# Patient Record
Sex: Female | Born: 1975 | Race: Asian | Hispanic: No | Marital: Married | Smoking: Never smoker
Health system: Southern US, Community
[De-identification: ages and names within clinical notes are randomized; demographics above are authoritative.]

## PROBLEM LIST (undated history)

## (undated) ENCOUNTER — Inpatient Hospital Stay (HOSPITAL_COMMUNITY): Payer: Self-pay

## (undated) DIAGNOSIS — E559 Vitamin D deficiency, unspecified: Secondary | ICD-10-CM

## (undated) DIAGNOSIS — N926 Irregular menstruation, unspecified: Secondary | ICD-10-CM

## (undated) DIAGNOSIS — L659 Nonscarring hair loss, unspecified: Secondary | ICD-10-CM

## (undated) DIAGNOSIS — Z8619 Personal history of other infectious and parasitic diseases: Secondary | ICD-10-CM

## (undated) DIAGNOSIS — N83209 Unspecified ovarian cyst, unspecified side: Secondary | ICD-10-CM

## (undated) DIAGNOSIS — D649 Anemia, unspecified: Secondary | ICD-10-CM

## (undated) DIAGNOSIS — K219 Gastro-esophageal reflux disease without esophagitis: Secondary | ICD-10-CM

## (undated) DIAGNOSIS — N970 Female infertility associated with anovulation: Secondary | ICD-10-CM

## (undated) DIAGNOSIS — E282 Polycystic ovarian syndrome: Secondary | ICD-10-CM

## (undated) HISTORY — PX: SKIN CANCER EXCISION: SHX779

## (undated) HISTORY — DX: Female infertility associated with anovulation: N97.0

## (undated) HISTORY — DX: Vitamin D deficiency, unspecified: E55.9

## (undated) HISTORY — DX: Nonscarring hair loss, unspecified: L65.9

## (undated) HISTORY — DX: Gastro-esophageal reflux disease without esophagitis: K21.9

## (undated) HISTORY — DX: Unspecified ovarian cyst, unspecified side: N83.209

## (undated) HISTORY — DX: Personal history of other infectious and parasitic diseases: Z86.19

## (undated) HISTORY — DX: Polycystic ovarian syndrome: E28.2

## (undated) HISTORY — PX: NO PAST SURGERIES: SHX2092

## (undated) HISTORY — DX: Irregular menstruation, unspecified: N92.6

---

## 2012-09-05 ENCOUNTER — Ambulatory Visit: Payer: Self-pay | Admitting: Obstetrics and Gynecology

## 2012-09-08 ENCOUNTER — Telehealth: Payer: Self-pay | Admitting: Obstetrics and Gynecology

## 2012-09-11 ENCOUNTER — Encounter: Payer: Self-pay | Admitting: Obstetrics and Gynecology

## 2012-09-11 ENCOUNTER — Ambulatory Visit (INDEPENDENT_AMBULATORY_CARE_PROVIDER_SITE_OTHER): Payer: BC Managed Care – PPO | Admitting: Obstetrics and Gynecology

## 2012-09-11 VITALS — BP 110/64 | Ht 64.5 in | Wt 105.0 lb

## 2012-09-11 DIAGNOSIS — E559 Vitamin D deficiency, unspecified: Secondary | ICD-10-CM | POA: Insufficient documentation

## 2012-09-11 DIAGNOSIS — N926 Irregular menstruation, unspecified: Secondary | ICD-10-CM | POA: Insufficient documentation

## 2012-09-11 DIAGNOSIS — N83209 Unspecified ovarian cyst, unspecified side: Secondary | ICD-10-CM | POA: Insufficient documentation

## 2012-09-11 DIAGNOSIS — E282 Polycystic ovarian syndrome: Secondary | ICD-10-CM

## 2012-09-11 DIAGNOSIS — L659 Nonscarring hair loss, unspecified: Secondary | ICD-10-CM | POA: Insufficient documentation

## 2012-09-11 DIAGNOSIS — N970 Female infertility associated with anovulation: Secondary | ICD-10-CM

## 2012-09-11 MED ORDER — INOSITOL-FOLIC ACID 2000-200 MG-MCG PO PACK: 1.0000 | PACK | Freq: Every day | ORAL | Status: DC

## 2012-09-11 NOTE — Patient Instructions (Signed)
Polycystic Ovarian Syndrome Polycystic ovarian syndrome is a condition with a number of problems. One problem is with the ovaries. The ovaries are organs located in the female pelvis, on each side of the uterus. Usually, during the menstrual cycle, an egg is released from 1 ovary every month. This is called ovulation. When the egg is fertilized, it goes into the womb (uterus), which allows for the growth of a baby. The egg travels from the ovary through the fallopian tube to the uterus. The ovaries also make the hormones estrogen and progesterone. These hormones help the development of a woman's breasts, body shape, and body hair. They also regulate the menstrual cycle and pregnancy. Sometimes, cysts form in the ovaries. A cyst is a fluid-filled sac. On the ovary, different types of cysts can form. The most common type of ovarian cyst is called a functional or ovulation cyst. It is normal, and often forms during the normal menstrual cycle. Each month, a woman's ovaries grow tiny cysts that hold the eggs. When an egg is fully grown, the sac breaks open. This releases the egg. Then, the sac which released the egg from the ovary dissolves. In one type of functional cyst, called a follicle cyst, the sac does not break open to release the egg. It may actually continue to grow. This type of cyst usually disappears within 1 to 3 months.  One type of cyst problem with the ovaries is called Polycystic Ovarian Syndrome (PCOS). In this condition, many follicle cysts form, but do not rupture and produce an egg. This health problem can affect the following:  Menstrual cycle.  Heart.  Obesity.  Cancer of the uterus.  Fertility.  Blood vessels.  Hair growth (face and body) or baldness.  Hormones.  Appearance.  High blood pressure.  Stroke.  Insulin production.  Inflammation of the liver.  Elevated blood cholesterol and triglycerides. CAUSES   No one knows the exact cause of PCOS.  Women with  PCOS often have a mother or sister with PCOS. There is not yet enough proof to say this is inherited.  Many women with PCOS have a weight problem.  Researchers are looking at the relationship between PCOS and the body's ability to make insulin. Insulin is a hormone that regulates the change of sugar, starches, and other food into energy for the body's use, or for storage. Some women with PCOS make too much insulin. It is possible that the ovaries react by making too many female hormones, called androgens. This can lead to acne, excessive hair growth, weight gain, and ovulation problems.  Too much production of luteinizing hormone (LH) from the pituitary gland in the brain stimulates the ovary to produce too much female hormone (androgen). SYMPTOMS   Infrequent or no menstrual periods, and/or irregular bleeding.  Inability to get pregnant (infertility), because of not ovulating.  Increased growth of hair on the face, chest, stomach, back, thumbs, thighs, or toes.  Acne, oily skin, or dandruff.  Pelvic pain.  Weight gain or obesity, usually carrying extra weight around the waist.  Type 2 diabetes (this is the diabetes that usually does not need insulin).  High cholesterol.  High blood pressure.  Female-pattern baldness or thinning hair.  Patches of thickened and dark brown or black skin on the neck, arms, breasts, or thighs.  Skin tags, or tiny excess flaps of skin, in the armpits or neck area.  Sleep apnea (excessive snoring and breathing stops at times while asleep).  Deepening of the voice.  Gestational diabetes when pregnant.  Increased risk of miscarriage with pregnancy. DIAGNOSIS  There is no single test to diagnose PCOS.   Your caregiver will:  Take a medical history.  Perform a pelvic exam.  Perform an ultrasound.  Check your female and female hormone levels.  Measure glucose or sugar levels in the blood.  Do other blood tests.  If you are producing too many  female hormones, your caregiver will make sure it is from PCOS. At the physical exam, your caregiver will want to evaluate the areas of increased hair growth. Try to allow natural hair growth for a few days before the visit.  During a pelvic exam, the ovaries may be enlarged or swollen by the increased number of small cysts. This can be seen more easily by vaginal ultrasound or screening, to examine the ovaries and lining of the uterus (endometrium) for cysts. The uterine lining may become thicker, if there has not been a regular period. TREATMENT  Because there is no cure for PCOS, it needs to be managed to prevent problems. Treatments are based on your symptoms. Treatment is also based on whether you want to have a baby or whether you need contraception.  Treatment may include:  Progesterone hormone, to start a menstrual period.  Birth control pills, to make you have regular menstrual periods.  Medicines to make you ovulate, if you want to get pregnant.  Medicines to control your insulin.  Medicine to control your blood pressure.  Medicine and diet, to control your high cholesterol and triglycerides in your blood.  Surgery, making small holes in the ovary, to decrease the amount of female hormone production. This is done through a long, lighted tube (laparoscope), placed into the pelvis through a tiny incision in the lower abdomen. Your caregiver will go over some of the choices with you. WOMEN WITH PCOS HAVE THESE CHARACTERISTICS:  High levels of female hormones called androgens.  An irregular or no menstrual cycle.  May have many small cysts in their ovaries. PCOS is the most common hormonal reproductive problem in women of childbearing age. WHY DO WOMEN WITH PCOS HAVE TROUBLE WITH THEIR MENSTRUAL CYCLE? Each month, about 20 eggs start to mature in the ovaries. As one egg grows and matures, the follicle breaks open to release the egg, so it can travel through the fallopian tube for  fertilization. When the single egg leaves the follicle, ovulation takes place. In women with PCOS, the ovary does not make all of the hormones it needs for any of the eggs to fully mature. They may start to grow and accumulate fluid, but no one egg becomes large enough. Instead, some may remain as cysts. Since no egg matures or is released, ovulation does not occur and the hormone progesterone is not made. Without progesterone, a woman's menstrual cycle is irregular or absent. Also, the cysts produce female hormones, which continue to prevent ovulation.  Document Released: 02/25/2005 Document Revised: 01/24/2012 Document Reviewed: 09/19/2009 [facility] Patient Information 2013 [place], [place].  Home Tests Luteinizing Hormone  Condition: Ovulation Purpose: Predicting time when woman is most likely to be fertile Format: SA Availability: OTC Comments: Certain monitoring devices can store information and accessed by the couple and their health provider for retrospective review.

## 2012-09-11 NOTE — Progress Notes (Signed)
ANNUAL:  Last Pap: 09/01/2011 HPV not detected WNL: Yes Regular Periods:no Contraception: None  Monthly Breast exam:no Tetanus<29yrs:no Nl.Bladder Function:yes Daily BMs:yes Healthy Diet:yes Calcium:yes Mammogram:no Date of Mammogram: n/a Exercise:yes Have often Exercise: occasionaly Seatbelt: yes Abuse at home: no Stressful work:no Sigmoid-colonoscopy: n/a Bone Density: No PCP: Gretta Obuck Change in PMH: None Change in [facility]: None  Subjective:    Wanda Reilly is a 36 y.o. female, G0P0000, who presents for an annual exam.     History   Social History  . Marital Status: Single    Spouse Name: N/A    Number of Children: N/A  . Years of Education: N/A   Social History Main Topics  . Smoking status: Never Smoker   . Smokeless tobacco: Never Used  . Alcohol Use: No  . Drug Use: No  . Sexually Active: Yes    Birth Control/ Protection: None   Other Topics Concern  . None   Social History Narrative  . None    Menstrual cycle:   LMP: Patient's last menstrual period was 09/01/2012.           Cycle: Now q26-27 days on pregnitude.  occ premenstrual breast tenderness and cramps first day  The following portions of the patient's history were reviewed and updated as appropriate: allergies, current medications, past family history, past medical history, past social history, past surgical history and problem list.  Review of Systems Pertinent items are noted in HPI. Breast:Negative for breast lump,nipple discharge or nipple retraction Gastrointestinal: Negative for abdominal pain, change in bowel habits or rectal bleeding Urinary:negative   Objective:    BP 110/64  Ht 5' 4.5" (1.638 m)  Wt 105 lb (47.628 kg)  BMI 17.74 kg/m2  LMP 09/01/2012    Weight:  Wt Readings from Last 1 Encounters:  09/11/12 105 lb (47.628 kg)          BMI: Body mass index is 17.74 kg/(m^2).  General Appearance: Alert, appropriate appearance for age. No acute distress HEENT: Grossly  normal Neck / Thyroid: Supple, no masses, nodes or enlargement Lungs: clear to auscultation bilaterally Back: No CVA tenderness Breast Exam: No masses or nodes.No dimpling, nipple retraction or discharge. Cardiovascular: Regular rate and rhythm. S1, S2, no murmur Gastrointestinal: Soft, non-tender, no masses or organomegaly Pelvic Exam: Vulva and vagina appear normal. Bimanual exam reveals normal uterus and adnexa. Rectovaginal: normal rectal, no masses Lymphatic Exam: Non-palpable nodes in neck, clavicular, axillary, or inguinal regions Skin: no rash or abnormalities Neurologic: Normal gait and speech, no tremor  Psychiatric: Alert and oriented, appropriate affect.   Wet Prep:not applicable Urinalysis:not applicable UPT: Not done   Assessment:    PCOS  Primary infertility   Plan:    Discussed ovulation after 3 more mos of trying to conceive Continue Pregnitude CSA for husband STD screening: declined Contraception:no method F/u 3mos   Dierdre Forth MD

## 2012-09-22 ENCOUNTER — Telehealth: Payer: Self-pay | Admitting: Obstetrics and Gynecology

## 2012-09-22 NOTE — Telephone Encounter (Signed)
Tc from pt. Informed pt of [facility] recs. Pt opts to have SA repeated x 6wks. Order for CSA faxed to [facility] and mailed to pt's home. Questions answered rgdg infertility. Pt may decide to proceed with infertility workup sooner than 3 months of trying to conceive naturally. Informed pt to call office 1st day of cycle to consult with provider rgdg labs needed for infertility workup. Pt voices understanding.

## 2012-09-22 NOTE — Telephone Encounter (Signed)
Tc to pt per telephone call. Informed pt VPH to review SA results and will cb with recs. Pt voices understanding.

## 2012-09-22 NOTE — Telephone Encounter (Signed)
Lm on vm to cb per VPH recs. 

## 2012-09-22 NOTE — Telephone Encounter (Signed)
Semen analysis is abnormal Options: Repeat in 6 weeks or refer to urologist at this time.  If patient wants referral to her urologist, refer to Dr. Brunilda Payor.  If she wants to repeat the semen analysis give her an additional order

## 2013-01-08 ENCOUNTER — Telehealth: Payer: Self-pay | Admitting: Obstetrics and Gynecology

## 2013-01-09 NOTE — Telephone Encounter (Signed)
Returned pt's call. States there is a possibility she is pregnant and is questioning if OK to continue Pregnitude. Menses was due 01/06/13 but had only brown spotting.  UPT nef 01/08/13. Also states was unsure when she ovulated past month. To wait a few days to retest.  Call wit positive result. Will consult with DR VPH concerning continuing supplement.

## 2013-01-10 ENCOUNTER — Telehealth: Payer: Self-pay | Admitting: Obstetrics and Gynecology

## 2013-01-10 NOTE — Telephone Encounter (Signed)
Message copied by Mason Jim on Wed Jan 10, 2013 10:05 AM ------      Message from: Dierdre Forth P      Created: Wed Jan 10, 2013  9:35 AM      Regarding: RE: Continue supplement?       yes      ----- Message -----         From: Constance Haw, RN         Sent: 01/09/2013   9:36 AM           To: Hal Morales, MD      Subject: Continue supplement?                                     Pt states may be pregnant and questioning if OK to continue Pregnitude. Thanks, Harriett Sine       ------

## 2013-01-10 NOTE — Telephone Encounter (Signed)
TC to pt. Per DR [facility]PH advised may continue Pregnitude.

## 2013-04-17 LAB — OB RESULTS CONSOLE ABO/RH: RH TYPE: NEGATIVE

## 2013-04-17 LAB — OB RESULTS CONSOLE RUBELLA ANTIBODY, IGM: Rubella: IMMUNE

## 2013-04-17 LAB — OB RESULTS CONSOLE GC/CHLAMYDIA
Chlamydia: NEGATIVE
GC PROBE AMP, GENITAL: NEGATIVE

## 2013-04-17 LAB — OB RESULTS CONSOLE ANTIBODY SCREEN: Antibody Screen: NEGATIVE

## 2013-04-17 LAB — OB RESULTS CONSOLE RPR: RPR: NONREACTIVE

## 2013-04-17 LAB — OB RESULTS CONSOLE HIV ANTIBODY (ROUTINE TESTING): HIV: NONREACTIVE

## 2013-04-17 LAB — OB RESULTS CONSOLE HEPATITIS B SURFACE ANTIGEN: Hepatitis B Surface Ag: NEGATIVE

## 2013-11-15 NOTE — L&D Delivery Note (Addendum)
Delivery Note At 1:45 PM a viable female was delivered via Vaginal, Vacuum (Extractor) (Presentation: ; Occiput Anterior).  APGAR: 9, 9; weight 6 lb 15.5 oz (3161 g).  VAVD performed secondary to maternal exhaustion.  Baby was delivered after 2 sets of pulls with 2 ctxs at 40-6450mmhg of pressure.   Placenta status: Intact, Spontaneous.  Cord: 3 vessels with the following complications: None.  Cord pH: n/a  Anesthesia: Epidural  Episiotomy: not done Lacerations: 3rd degree;Perineal Suture Repair: 2.0 vicryl Est. Blood Loss (mL): 400 cc  Mom to postpartum.  Baby to Nursery.  [name],Wanda Spivey Y 11/29/2013, 3:55 PM

## 2013-11-17 ENCOUNTER — Inpatient Hospital Stay (HOSPITAL_COMMUNITY)
Admission: AD | Admit: 2013-11-17 | Payer: BC Managed Care – PPO | Source: Ambulatory Visit | Admitting: Obstetrics and Gynecology

## 2013-11-27 ENCOUNTER — Encounter (HOSPITAL_COMMUNITY): Payer: Self-pay | Admitting: *Deleted

## 2013-11-27 ENCOUNTER — Telehealth (HOSPITAL_COMMUNITY): Payer: Self-pay | Admitting: *Deleted

## 2013-11-27 NOTE — Telephone Encounter (Signed)
Preadmission screen  

## 2013-11-28 ENCOUNTER — Inpatient Hospital Stay (HOSPITAL_COMMUNITY)
Admission: RE | Admit: 2013-11-28 | Discharge: 2013-12-01 | DRG: 775 | Disposition: A | Discharge status: Home / Self Care | Payer: BC Managed Care – PPO | Source: Ambulatory Visit | Attending: Obstetrics and Gynecology | Admitting: Obstetrics and Gynecology

## 2013-11-28 ENCOUNTER — Encounter (HOSPITAL_COMMUNITY): Payer: Self-pay

## 2013-11-28 ENCOUNTER — Encounter (HOSPITAL_COMMUNITY): Payer: BC Managed Care – PPO | Admitting: Anesthesiology

## 2013-11-28 ENCOUNTER — Inpatient Hospital Stay (HOSPITAL_COMMUNITY): Payer: BC Managed Care – PPO | Admitting: Anesthesiology

## 2013-11-28 DIAGNOSIS — O99892 Other specified diseases and conditions complicating childbirth: Secondary | ICD-10-CM | POA: Diagnosis present

## 2013-11-28 DIAGNOSIS — O09519 Supervision of elderly primigravida, unspecified trimester: Secondary | ICD-10-CM | POA: Diagnosis present

## 2013-11-28 DIAGNOSIS — Z22330 Carrier of Group B streptococcus: Secondary | ICD-10-CM

## 2013-11-28 DIAGNOSIS — O9989 Other specified diseases and conditions complicating pregnancy, childbirth and the puerperium: Secondary | ICD-10-CM

## 2013-11-28 DIAGNOSIS — O480 Post-term pregnancy: Secondary | ICD-10-CM | POA: Diagnosis present

## 2013-11-28 DIAGNOSIS — D649 Anemia, unspecified: Secondary | ICD-10-CM | POA: Diagnosis not present

## 2013-11-28 DIAGNOSIS — O99820 Streptococcus B carrier state complicating pregnancy: Secondary | ICD-10-CM

## 2013-11-28 DIAGNOSIS — D62 Acute posthemorrhagic anemia: Secondary | ICD-10-CM | POA: Diagnosis not present

## 2013-11-28 DIAGNOSIS — O9903 Anemia complicating the puerperium: Secondary | ICD-10-CM | POA: Diagnosis not present

## 2013-11-28 LAB — CBC
HCT: 41.2 % (ref 36.0–46.0)
Hemoglobin: 14.5 g/dL (ref 12.0–15.0)
MCH: 30.7 pg (ref 26.0–34.0)
MCHC: 35.2 g/dL (ref 30.0–36.0)
MCV: 87.1 fL (ref 78.0–100.0)
PLATELETS: 181 10*3/uL (ref 150–400)
RBC: 4.73 MIL/uL (ref 3.87–5.11)
RDW: 14.9 % (ref 11.5–15.5)
WBC: 13.7 10*3/uL — AB (ref 4.0–10.5)

## 2013-11-28 LAB — OB RESULTS CONSOLE GBS: GBS: POSITIVE

## 2013-11-28 LAB — TYPE AND SCREEN
ABO/RH(D): O NEG
ANTIBODY SCREEN: NEGATIVE

## 2013-11-28 MED ORDER — LACTATED RINGERS IV SOLN: 500.0000 mL | INTRAVENOUS | Status: DC | PRN

## 2013-11-28 MED ORDER — PHENYLEPHRINE 40 MCG/ML (10ML) SYRINGE FOR IV PUSH (FOR BLOOD PRESSURE SUPPORT)
80.0000 ug | PREFILLED_SYRINGE | INTRAVENOUS | Status: DC | PRN
  Filled 2013-11-28: qty 2

## 2013-11-28 MED ORDER — PENICILLIN G POTASSIUM 5000000 UNITS IJ SOLR
5.0000 10*6.[IU] | Freq: Once | INTRAVENOUS | Status: AC
  Administered 2013-11-28: 5 10*6.[IU] via INTRAVENOUS
  Filled 2013-11-28: qty 5

## 2013-11-28 MED ORDER — OXYTOCIN 40 UNITS IN LACTATED RINGERS INFUSION - SIMPLE MED: 62.5000 mL/h | INTRAVENOUS | Status: DC

## 2013-11-28 MED ORDER — LACTATED RINGERS IV SOLN
INTRAVENOUS | Status: DC
  Administered 2013-11-28 (×2): via INTRAVENOUS

## 2013-11-28 MED ORDER — CITRIC ACID-SODIUM CITRATE 334-500 MG/5ML PO SOLN: 30.0000 mL | ORAL | Status: DC | PRN

## 2013-11-28 MED ORDER — BUTORPHANOL TARTRATE 1 MG/ML IJ SOLN: 1.0000 mg | INTRAMUSCULAR | Status: DC | PRN

## 2013-11-28 MED ORDER — DIPHENHYDRAMINE HCL 50 MG/ML IJ SOLN: 12.5000 mg | INTRAMUSCULAR | Status: DC | PRN

## 2013-11-28 MED ORDER — PHENYLEPHRINE 40 MCG/ML (10ML) SYRINGE FOR IV PUSH (FOR BLOOD PRESSURE SUPPORT)
80.0000 ug | PREFILLED_SYRINGE | INTRAVENOUS | Status: DC | PRN
  Filled 2013-11-28: qty 10
  Filled 2013-11-28: qty 2

## 2013-11-28 MED ORDER — LIDOCAINE HCL (PF) 1 % IJ SOLN
30.0000 mL | INTRAMUSCULAR | Status: DC | PRN
  Filled 2013-11-28 (×2): qty 30

## 2013-11-28 MED ORDER — EPHEDRINE 5 MG/ML INJ
10.0000 mg | INTRAVENOUS | Status: DC | PRN
  Filled 2013-11-28: qty 2
  Filled 2013-11-28: qty 4

## 2013-11-28 MED ORDER — FENTANYL 2.5 MCG/ML BUPIVACAINE 1/10 % EPIDURAL INFUSION (WH - ANES)
14.0000 mL/h | INTRAMUSCULAR | Status: DC | PRN
  Administered 2013-11-28 – 2013-11-29 (×2): 14 mL/h via EPIDURAL
  Filled 2013-11-28 (×2): qty 125

## 2013-11-28 MED ORDER — FENTANYL CITRATE 0.05 MG/ML IJ SOLN
100.0000 ug | INTRAMUSCULAR | Status: DC | PRN
  Filled 2013-11-28 (×2): qty 2

## 2013-11-28 MED ORDER — STERILE WATER FOR INJECTION IJ SOLN
10.0000 mL | INTRAMUSCULAR | Status: DC | PRN
  Administered 2013-11-28: 10 mL via INTRAMUSCULAR
  Filled 2013-11-28: qty 10

## 2013-11-28 MED ORDER — ONDANSETRON HCL 4 MG/2ML IJ SOLN: 4.0000 mg | Freq: Four times a day (QID) | INTRAMUSCULAR | Status: DC | PRN

## 2013-11-28 MED ORDER — OXYTOCIN BOLUS FROM INFUSION: 500.0000 mL | INTRAVENOUS | Status: DC

## 2013-11-28 MED ORDER — PENICILLIN G POTASSIUM 5000000 UNITS IJ SOLR
2.5000 10*6.[IU] | INTRAVENOUS | Status: DC
  Administered 2013-11-28 – 2013-11-29 (×4): 2.5 10*6.[IU] via INTRAVENOUS
  Filled 2013-11-28 (×8): qty 2.5

## 2013-11-28 MED ORDER — LACTATED RINGERS IV SOLN
500.0000 mL | Freq: Once | INTRAVENOUS | Status: AC
  Administered 2013-11-28: 500 mL via INTRAVENOUS

## 2013-11-28 MED ORDER — FENTANYL CITRATE 0.05 MG/ML IJ SOLN
100.0000 ug | INTRAMUSCULAR | Status: DC | PRN
  Administered 2013-11-28: 100 ug via INTRAVENOUS

## 2013-11-28 MED ORDER — EPHEDRINE 5 MG/ML INJ
10.0000 mg | INTRAVENOUS | Status: DC | PRN
  Filled 2013-11-28: qty 2

## 2013-11-28 MED ORDER — LIDOCAINE HCL (PF) 1 % IJ SOLN
INTRAMUSCULAR | Status: DC | PRN
  Administered 2013-11-28 (×4): 4 mL

## 2013-11-28 MED ORDER — ACETAMINOPHEN 325 MG PO TABS
650.0000 mg | ORAL_TABLET | ORAL | Status: DC | PRN
  Administered 2013-11-29: 650 mg via ORAL
  Filled 2013-11-28: qty 2

## 2013-11-28 MED ORDER — OXYCODONE-ACETAMINOPHEN 5-325 MG PO TABS: 1.0000 | ORAL_TABLET | ORAL | Status: DC | PRN

## 2013-11-28 NOTE — H&P (Signed)
Wanda Reilly is a 38 y.o. female presenting for induction after waiting for a bed to become available on L&D all day.  Denies LOF, VB and report good FM and ctxs about q 10min.  History OB History   Grav Para Term Preterm Abortions TAB SAB Ect Mult Living   1 0 0 0 0 0 0 0 0 0      Past Medical History  Diagnosis Date  . PCOS (polycystic ovarian syndrome)   . Alopecia   . Vitamin D deficiency   . Anovulation   . Ovarian cyst   . Irregular menses   . GERD (gastroesophageal reflux disease)   . H/O varicella    Past Surgical History  Procedure Laterality Date  . No past surgeries     Family History: family history includes Hypertension in her mother. Social History:  reports that she has never smoked. She has never used smokeless tobacco. She reports that she does not drink alcohol or use illicit drugs.   Prenatal Transfer Tool  Maternal Diabetes: No Genetic Screening: Normal Maternal Ultrasounds/Referrals: Normal Fetal Ultrasounds or other Referrals:  None Maternal Substance Abuse:  No Significant Maternal Medications:  None Significant Maternal Lab Results:  Lab values include: Group B Strep positive Other Comments:  None  ROS Non-contributory   Height 5\' 4"  (1.626 m), weight 63.957 kg (141 lb), last menstrual period 02/10/2013. Exam Physical Exam   Lungs CTA CV RRR ABd gravid, NT Ext no calf tenderness  VE 3-4cm per RN FHT 140s, + accels, no decels, + variability Toco q 5 min  Prenatal labs: ABO, Rh: O/Negative/-- (06/03 0000) Antibody: Negative (06/03 0000) Rubella: Immune (06/03 0000) RPR: Nonreactive (06/03 0000)  HBsAg: Negative (06/03 0000)  HIV: Non-reactive (06/03 0000)  GBS:  positive  Assessment/Plan: P1 at 41 4/7 wks presenting for induction but found to be in early labor.  If not progressing, will augment with pitocin but will observe for now.  Fetal status is reassuring.   [name],Frederick Klinger Y 11/28/2013, 6:37 PM

## 2013-11-28 NOTE — Anesthesia Procedure Notes (Signed)
Epidural Patient location during procedure: OB Start time: 11/28/2013 10:36 PM  Staffing Performed by: anesthesiologist   Preanesthetic Checklist Completed: patient identified, site marked, surgical consent, pre-op evaluation, timeout performed, IV checked, risks and benefits discussed and monitors and equipment checked  Epidural Patient position: sitting Prep: site prepped and draped and DuraPrep Patient monitoring: continuous pulse ox and blood pressure Approach: midline Injection technique: LOR air  Needle:  Needle type: Tuohy  Needle gauge: 17 G Needle length: 9 cm and 9 Needle insertion depth: 4 cm Catheter type: closed end flexible Catheter size: 19 Gauge Catheter at skin depth: 9 cm Test dose: negative  Assessment Events: blood not aspirated, injection not painful, no injection resistance, negative IV test and no paresthesia  Additional Notes Discussed risk of headache, infection, bleeding, nerve injury and failed or incomplete block.  Patient voices understanding and wishes to proceed. Epidural placed easily on first pass.  No paresthesia.  Patient tolerated procedure well with no apparent complications.  Jasmine DecemberA. Jaycelyn Orrison, MDReason for block:procedure for pain

## 2013-11-28 NOTE — Anesthesia Preprocedure Evaluation (Signed)
Anesthesia Evaluation  Patient identified by MRN, date of birth, ID band Patient awake    Reviewed: Allergy & Precautions, H&P , NPO status , Patient's Chart, lab work & pertinent test results, reviewed documented beta blocker date and time   History of Anesthesia Complications Negative for: history of anesthetic complications  Airway Mallampati: II TM Distance: >3 FB Neck ROM: full    Dental  (+) Teeth Intact   Pulmonary neg pulmonary ROS,  breath sounds clear to auscultation        Cardiovascular negative cardio ROS  Rhythm:regular Rate:Normal     Neuro/Psych negative neurological ROS  negative psych ROS   GI/Hepatic Neg liver ROS, GERD-  ,  Endo/Other  PCOS  Renal/GU negative Renal ROS  Female GU complaint     Musculoskeletal   Abdominal   Peds  Hematology negative hematology ROS (+)   Anesthesia Other Findings   Reproductive/Obstetrics (+) Pregnancy                           Anesthesia Physical Anesthesia Plan  ASA: II  Anesthesia Plan: Epidural   Post-op Pain Management:    Induction:   Airway Management Planned:   Additional Equipment:   Intra-op Plan:   Post-operative Plan:   Informed Consent: I have reviewed the patients History and Physical, chart, labs and discussed the procedure including the risks, benefits and alternatives for the proposed anesthesia with the patient or authorized representative who has indicated his/her understanding and acceptance.     Plan Discussed with:   Anesthesia Plan Comments:         Anesthesia Quick Evaluation

## 2013-11-28 NOTE — Progress Notes (Signed)
Patient ID: Wanda Reilly, female   DOB: 19-Sep-1976, 38 y.o.   MRN: [id number] Wanda Reilly is a 38 y.o. G1P0000 at 7480w4d admitted for early labor/IOL   Subjective: rcv'd sterile water injections and had relief for about 30 minutes, breathing w ctx, requesting IV pain meds   Objective: BP 114/74  Pulse 93  Temp(Src) 97.9 F (36.6 C) (Oral)  Resp 18  Ht 5\' 4"  (1.626 m)  Wt 141 lb (63.957 kg)  BMI 24.19 kg/m2  LMP 02/10/2013     FHT:  Cat 1 UC:   toco 3-4   SVE:   Dilation: 5 Effacement (%): 100 Station: -1 Exam by:: s. Pegi Milazzo, cnm    Assessment / Plan:  Labor: Progressing normally Preeclampsia:  no s/s Fetal Wellbeing:  Category I Pain Control:  Fentanyl Anticipated MOD:  NSVD  GBS pos, rcv'd PCN Will AROM 4hrs after rcv'd PCN Fentanyl IVP, now, epidural PRN   Update physician PRN   Graziella Connery M 11/28/2013, 10:10 PM

## 2013-11-28 NOTE — Progress Notes (Signed)
Patient ID: Wanda PoundsKamali Godown, female   DOB: 1976/11/06, 38 y.o.   MRN: [id number] Wanda Reilly is a 38 y.o. G1P0000 at 8944w4d admitted for early labor/IOL   Subjective: Ctx getting stronger, states she is coping with them for now, considering pain meds,   Objective: BP 114/74  Pulse 93  Temp(Src) 97.9 F (36.6 C) (Oral)  Resp 18  Ht 5\' 4"  (1.626 m)  Wt 141 lb (63.957 kg)  BMI 24.19 kg/m2  LMP 02/10/2013     FHT:  Cat 1 UC:   toco 3-4   SVE:  Exam deferred    Assessment / Plan:  Labor: early labor  Preeclampsia:  no s/s Fetal Wellbeing:  Category I Pain Control:  Labor support without medications Anticipated MOD:  NSVD  GBS pos, PCN just started  Will check cervix as labor progresses, consider pitocin augmentation if no further change  Pain meds PRN    Update physician PRN   Khayree Delellis M 11/28/2013, 10:07 PM

## 2013-11-29 DIAGNOSIS — O99820 Streptococcus B carrier state complicating pregnancy: Secondary | ICD-10-CM

## 2013-11-29 LAB — RPR: RPR Ser Ql: NONREACTIVE

## 2013-11-29 LAB — ABO/RH: ABO/RH(D): O NEG

## 2013-11-29 MED ORDER — SIMETHICONE 80 MG PO CHEW
80.0000 mg | CHEWABLE_TABLET | ORAL | Status: DC | PRN
Start: 2013-11-29 — End: 2013-12-01

## 2013-11-29 MED ORDER — FENTANYL CITRATE 0.05 MG/ML IJ SOLN
100.0000 ug | Freq: Once | INTRAMUSCULAR | Status: AC
  Administered 2013-11-29: 100 ug via EPIDURAL

## 2013-11-29 MED ORDER — DIPHENHYDRAMINE HCL 25 MG PO CAPS: 25.0000 mg | ORAL_CAPSULE | Freq: Four times a day (QID) | ORAL | Status: DC | PRN

## 2013-11-29 MED ORDER — ACETAMINOPHEN 325 MG PO TABS
650.0000 mg | ORAL_TABLET | Freq: Four times a day (QID) | ORAL | Status: DC | PRN
  Filled 2013-11-29: qty 2

## 2013-11-29 MED ORDER — PRENATAL MULTIVITAMIN CH
1.0000 | ORAL_TABLET | Freq: Every day | ORAL | Status: DC
  Administered 2013-11-30 – 2013-12-01 (×2): 1 via ORAL
  Filled 2013-11-29 (×2): qty 1

## 2013-11-29 MED ORDER — OXYCODONE-ACETAMINOPHEN 5-325 MG PO TABS
1.0000 | ORAL_TABLET | ORAL | Status: DC | PRN
  Administered 2013-11-29 – 2013-12-01 (×6): 2 via ORAL
  Filled 2013-11-29 (×6): qty 2

## 2013-11-29 MED ORDER — MEASLES, MUMPS & RUBELLA VAC ~~LOC~~ INJ: 0.5000 mL | INJECTION | Freq: Once | SUBCUTANEOUS | Status: DC

## 2013-11-29 MED ORDER — LACTATED RINGERS IV BOLUS (SEPSIS): 500.0000 mL | Freq: Once | INTRAVENOUS | Status: DC

## 2013-11-29 MED ORDER — OXYTOCIN 40 UNITS IN LACTATED RINGERS INFUSION - SIMPLE MED
1.0000 m[IU]/min | INTRAVENOUS | Status: DC
  Administered 2013-11-29: 2 m[IU]/min via INTRAVENOUS
  Administered 2013-11-29: 666 m[IU]/min via INTRAVENOUS
  Filled 2013-11-29: qty 1000

## 2013-11-29 MED ORDER — ZOLPIDEM TARTRATE 5 MG PO TABS: 5.0000 mg | ORAL_TABLET | Freq: Every evening | ORAL | Status: DC | PRN

## 2013-11-29 MED ORDER — BUPIVACAINE HCL (PF) 0.25 % IJ SOLN
INTRAMUSCULAR | Status: DC | PRN
  Administered 2013-11-29: 5 mL via EPIDURAL

## 2013-11-29 MED ORDER — MEDROXYPROGESTERONE ACETATE 150 MG/ML IM SUSP: 150.0000 mg | INTRAMUSCULAR | Status: DC | PRN

## 2013-11-29 MED ORDER — SENNOSIDES-DOCUSATE SODIUM 8.6-50 MG PO TABS
2.0000 | ORAL_TABLET | ORAL | Status: DC
  Administered 2013-11-30 – 2013-12-01 (×2): 2 via ORAL
  Filled 2013-11-29 (×2): qty 2

## 2013-11-29 MED ORDER — ONDANSETRON HCL 4 MG PO TABS: 4.0000 mg | ORAL_TABLET | ORAL | Status: DC | PRN

## 2013-11-29 MED ORDER — FLEET ENEMA 7-19 GM/118ML RE ENEM: 1.0000 | ENEMA | Freq: Every day | RECTAL | Status: DC | PRN

## 2013-11-29 MED ORDER — TETANUS-DIPHTH-ACELL PERTUSSIS 5-2.5-18.5 LF-MCG/0.5 IM SUSP: 0.5000 mL | Freq: Once | INTRAMUSCULAR | Status: DC

## 2013-11-29 MED ORDER — BISACODYL 10 MG RE SUPP: 10.0000 mg | Freq: Every day | RECTAL | Status: DC | PRN

## 2013-11-29 MED ORDER — ONDANSETRON HCL 4 MG/2ML IJ SOLN: 4.0000 mg | INTRAMUSCULAR | Status: DC | PRN

## 2013-11-29 MED ORDER — BENZOCAINE-MENTHOL 20-0.5 % EX AERO
1.0000 "application " | INHALATION_SPRAY | CUTANEOUS | Status: DC | PRN
  Administered 2013-11-29: 1 via TOPICAL
  Filled 2013-11-29: qty 56

## 2013-11-29 MED ORDER — WITCH HAZEL-GLYCERIN EX PADS: 1.0000 "application " | MEDICATED_PAD | CUTANEOUS | Status: DC | PRN

## 2013-11-29 MED ORDER — LANOLIN HYDROUS EX OINT: TOPICAL_OINTMENT | CUTANEOUS | Status: DC | PRN

## 2013-11-29 MED ORDER — DIBUCAINE 1 % RE OINT: 1.0000 "application " | TOPICAL_OINTMENT | RECTAL | Status: DC | PRN

## 2013-11-29 NOTE — Progress Notes (Signed)
No postpartum orders in computer for patient. Called Dr. Estanislado Pandyivard at [phone] who is on call from [phone] and had to leave message. No response by [phone], so called home number. Was instructed to call cell. Will continue to attempt to get postpartum orders placed. Earl Galasborne, Linda HedgesStefanie [place] Green[place]udspeth

## 2013-11-29 NOTE — Progress Notes (Signed)
Wanda Reilly is a 38 y.o. G1P0000 at 5360w5d   Subjective:   Objective: BP 101/53  Pulse 95  Temp(Src) 98.6 F (37 C) (Oral)  Resp 16  Ht 5\' 4"  (1.626 m)  Wt 63.957 kg (141 lb)  BMI 24.19 kg/m2  SpO2 97%  LMP 02/10/2013 I/O last 3 completed shifts: In: -  Out: 1000 [Urine:1000]    FHT:  Cat 2 occas decel UC:   regular, every 2-3 minutes SVE:   Dilation: 9-10  Effacement (%): 100 Station: +1 Exam by:: RN  Labs: Lab Results  Component Value Date   WBC 13.7* 11/28/2013   HGB 14.5 11/28/2013   HCT 41.2 11/28/2013   MCV 87.1 11/28/2013   PLT 181 11/28/2013    Assessment / Plan: Augmentation of labor, progressing well  Labor: Progressing normally Preeclampsia:  no signs or symptoms of toxicity Fetal Wellbeing:  Category II - anticipating NSVD Pain Control:  Epidural I/D:  GBS + on PCN, no s/sxs of toxicity Anticipated MOD:  NSVD  Ojas Coone Y 11/29/2013, 11:29 AM

## 2013-11-29 NOTE — Progress Notes (Signed)
[facility] Lillard, who is now on call, for postpartum orders. Message left. Changing shifts. Will notify next nurse of absent postpartum orders as of now. Earl Galasborne, Linda HedgesStefanie [place]

## 2013-11-29 NOTE — Progress Notes (Signed)
Patient ID: Wanda Reilly, female   DOB: August 09, 1976, 38 y.o.   MRN: [id number] Wanda Reilly is a 38 y.o. G1P0000 at 7529w5d admitted for labor  Subjective: Overall comfortable w epidural, L side more numb, c/o L thigh swelling, now sitting up in bed HF, has been in various positions   Objective: BP 118/69  Pulse 88  Temp(Src) 97.6 F (36.4 C) (Oral)  Resp 18  Ht 5\' 4"  (1.626 m)  Wt 141 lb (63.957 kg)  BMI 24.19 kg/m2  SpO2 99%  LMP 02/10/2013  Total I/O In: -  Out: 1000 [Urine:1000]  FHT:  Cat 1 UC:   toco 1-2, MVU's 130's, but pitocin decreased from 6mu to 2mu for tachysystole   SVE:   Dilation: 8.5 Effacement (%): 90 Station: -1 Exam by:: s. Christyana Corwin, cnm  vtx ?OP asynclitic    Assessment / Plan:  Labor: progressing on pitocin,  Preeclampsia:  no s/s Fetal Wellbeing:  Category I Pain Control:  Epidural Anticipated MOD:  NSVD  GBS pos, rcv'd PCN Continue pitocin titration L thigh w slightly more edema noted, Dr Rodman Pickleassidy notified, and states edema not related to epidural  Pt reassured about numbness Will CTO closely    Update physician PRN   Malissa HippoLILLARD,Michel Eskelson M 11/29/2013, 5:28 AM

## 2013-11-29 NOTE — Progress Notes (Signed)
Patient ID: Wanda Reilly, female   DOB: [birth year], 38 y.o.   MRN: [id number] Wanda Reilly is a 38 y.o. G1P0000 at 777w5d admitted for early labor  Subjective: Comfortable w epidural now, sleepy   Objective: BP 105/75  Pulse 73  Temp(Src) 97.4 F (36.3 C) (Oral)  Resp 18  Ht 5\' 4"  (1.626 m)  Wt 141 lb (63.957 kg)  BMI 24.19 kg/m2  SpO2 99%  LMP 02/10/2013     FHT:  Cat 1 UC:   toco 5-7  SVE:   5/100/-1 AROM, scant clear fluid, bloody show IUPC placed without difficulty    Assessment / Plan:  Labor: early labor Preeclampsia:  no s/s Fetal Wellbeing:  Category I Pain Control:  Epidural Anticipated MOD:  NSVD  GBS pos, rcv'd PCN Will begin pitocin augmentation for adequate ctx    Dr Estanislado Pandyivard updated    Malissa HippoLILLARD,Jonell Krontz M 11/29/2013, 3:36 AM

## 2013-11-29 NOTE — Progress Notes (Signed)
Dr. Lemont Fillersassady notified that pt still has epidural in place/ She was notified of platelet count and blood pressures. Dr. Lemont Fillersassady gave the okay to pull catheter. L and D nurse will come pull it.

## 2013-11-30 ENCOUNTER — Encounter (HOSPITAL_COMMUNITY): Payer: Self-pay

## 2013-11-30 LAB — CBC
HCT: 23.2 % — ABNORMAL LOW (ref 36.0–46.0)
HEMOGLOBIN: 7.9 g/dL — AB (ref 12.0–15.0)
MCH: 29.9 pg (ref 26.0–34.0)
MCHC: 34.1 g/dL (ref 30.0–36.0)
MCV: 87.9 fL (ref 78.0–100.0)
Platelets: 154 10*3/uL (ref 150–400)
RBC: 2.64 MIL/uL — AB (ref 3.87–5.11)
RDW: 15.1 % (ref 11.5–15.5)
WBC: 19.8 10*3/uL — ABNORMAL HIGH (ref 4.0–10.5)

## 2013-11-30 MED ORDER — FERROUS SULFATE 325 (65 FE) MG PO TABS
325.0000 mg | ORAL_TABLET | Freq: Two times a day (BID) | ORAL | Status: DC
  Administered 2013-11-30 – 2013-12-01 (×3): 325 mg via ORAL
  Filled 2013-11-30 (×3): qty 1

## 2013-11-30 MED ORDER — IBUPROFEN 600 MG PO TABS
600.0000 mg | ORAL_TABLET | Freq: Four times a day (QID) | ORAL | Status: DC
  Administered 2013-11-30 – 2013-12-01 (×6): 600 mg via ORAL
  Filled 2013-11-30 (×6): qty 1

## 2013-11-30 NOTE — Lactation Note (Signed)
This note was copied from the chart of Wanda Reilly. Lactation Consultation Note     Follow up consult with this mom and term baby, now 29 hours post partum. Mom has been having nipple pain with latching. I showed mom how to position both herself and baby for cross cradle, and how to start nipple to nose, wait for a wide mouth, and latch bottom to top. Mom was thrilled when she was able to latch independently, and without discomfort. Basic breast feeding teaching done from the Baby and Me booklet. Mom aware lactation available after discharge, as needed.   Patient Name: Wanda Reilly's Date: 11/30/2013 Reason for consult: Follow-up assessment   Maternal Data    Feeding Feeding Type: Breast Fed Length of feed: 20 min (vbaby was still breast feeding when I left the room)  LATCH Score/Interventions Latch: Grasps breast easily, tongue down, lips flanged, rhythmical sucking. Intervention(s): Adjust position;Assist with latch  Audible Swallowing: Spontaneous and intermittent  Type of Nipple: Everted at rest and after stimulation  Comfort (Breast/Nipple): Soft / non-tender     Hold (Positioning): Assistance needed to correctly position infant at breast and maintain latch. Intervention(s): Breastfeeding basics reviewed;Support Pillows;Position options;Skin to skin  LATCH Score: 9  Lactation Tools Discussed/Used     Consult Status Consult Status: Follow-up Date: 12/01/13 Follow-up type: In-patient    Alfred LevinsLee, Gerold Sar Anne 11/30/2013, 6:50 PM

## 2013-11-30 NOTE — Progress Notes (Signed)
Post Partum Day 1:S/P VAVD with 3rd degree lac  Subjective: Patient up ad lib, denies syncope or dizziness. Feeding:  Breast Contraceptive plan:   Discussed, but undecided  Objective: Blood pressure 91/58, pulse 103, temperature 98.6 F (37 C), temperature source Oral, resp. rate 18, height 5\' 4"  (1.626 m), weight 141 lb (63.957 kg), last menstrual period 02/10/2013, SpO2 100.00%, unknown if currently breastfeeding.  Filed Vitals:   11/29/13 1800 11/29/13 1855 11/29/13 2232 11/30/13 0621  BP: 98/61 100/55 93/61 91/58   Pulse: 84 99 102 103  Temp: 98.4 F (36.9 C) 98.2 F (36.8 C) 98.3 F (36.8 C) 98.6 F (37 C)  TempSrc: Oral Oral Oral Oral  Resp: 18 18 18 18   Height:      Weight:      SpO2: 100%       Physical Exam:  General: alert, labile Lochia: appropriate Uterine Fundus: firm Incision: healing well DVT Evaluation: No evidence of DVT seen on physical exam. Negative Homan's sign.   Recent Labs  11/28/13 1830 11/30/13 0543  HGB 14.5 7.9*  HCT 41.2 23.2*    Assessment/Plan: S/P Vaginal delivery day 1 Asymptomatic Anemia 3rd degree Laceration  Continue current care  Orthostatic Vitals Starting Fe+  Discussed transfusion, will defer at present due to patient clinical stability. CBC 12/01/2013 in am Reviewed care of 3rd degree laceration, reassured patient Plan for discharge tomorrow   LOS: 2 days   Nigel BridgemanLATHAM, Jasline Buskirk 11/30/2013, 9:35 AM

## 2013-11-30 NOTE — Progress Notes (Signed)
Spoke with patient about pain control. Patient states that percocet does not seem to help with perineal soreness significantly and states that she has taken motrin in the past and has not had an issue with it. Notified Nigel BridgemanVicki Latham who states she will order motrin. Earl Galasborne, Linda HedgesStefanie HallowellHudspeth

## 2013-11-30 NOTE — Anesthesia Postprocedure Evaluation (Signed)
  Anesthesia Post-op Note  Patient: Wanda Reilly  Procedure(s) Performed: * No procedures listed *  Patient Location: Mother/Baby  Anesthesia Type:Epidural  Level of Consciousness: awake and alert   Airway and Oxygen Therapy: Patient Spontanous Breathing  Post-op Pain: mild  Post-op Assessment: Patient's Cardiovascular Status Stable, Respiratory Function Stable, No signs of Nausea or vomiting, Pain level controlled, No headache and No backache  Post-op Vital Signs: stable  Complications: No apparent anesthesia complications

## 2013-12-01 DIAGNOSIS — D62 Acute posthemorrhagic anemia: Secondary | ICD-10-CM | POA: Diagnosis not present

## 2013-12-01 LAB — CBC
HCT: 19 % — ABNORMAL LOW (ref 36.0–46.0)
Hemoglobin: 6.5 g/dL — CL (ref 12.0–15.0)
MCH: 30.6 pg (ref 26.0–34.0)
MCHC: 34.7 g/dL (ref 30.0–36.0)
MCV: 88 fL (ref 78.0–100.0)
PLATELETS: 156 10*3/uL (ref 150–400)
RBC: 2.16 MIL/uL — AB (ref 3.87–5.11)
RDW: 15.2 % (ref 11.5–15.5)
WBC: 12.4 10*3/uL — AB (ref 4.0–10.5)

## 2013-12-01 MED ORDER — OXYCODONE-ACETAMINOPHEN 5-325 MG PO TABS: 1.0000 | ORAL_TABLET | ORAL | Status: DC | PRN

## 2013-12-01 MED ORDER — FERROUS SULFATE 325 (65 FE) MG PO TABS: 325.0000 mg | ORAL_TABLET | Freq: Two times a day (BID) | ORAL | Status: DC

## 2013-12-01 MED ORDER — IBUPROFEN 600 MG PO TABS: 600.0000 mg | ORAL_TABLET | Freq: Four times a day (QID) | ORAL | Status: DC

## 2013-12-01 NOTE — Discharge Summary (Signed)
Obstetric Discharge Summary Reason for Admission: induction of labor Prenatal Procedures: NST and ultrasound Intrapartum Procedures: vacuum and GBS prophylaxis Postpartum Procedures: none Complications-Operative and Postpartum: 3rd degree perineal laceration  Results for orders placed during the hospital encounter of 11/28/13 (from the past 48 hour(s))  CBC     Status: Abnormal   Collection Time    11/30/13  5:43 AM      Result Value Range   WBC 19.8 (*) 4.0 - 10.5 K/uL   RBC 2.64 (*) 3.87 - 5.11 MIL/uL   Hemoglobin 7.9 (*) 12.0 - 15.0 g/dL   Comment: DELTA CHECK NOTED     REPEATED TO VERIFY   HCT 23.2 (*) 36.0 - 46.0 %   MCV 87.9  78.0 - 100.0 fL   MCH 29.9  26.0 - 34.0 pg   MCHC 34.1  30.0 - 36.0 g/dL   RDW 16.115.1  09.611.5 - 04.515.5 %   Platelets 154  150 - 400 K/uL  CBC     Status: Abnormal   Collection Time    12/01/13  6:18 AM      Result Value Range   WBC 12.4 (*) 4.0 - 10.5 K/uL   Comment: ADJUSTED FOR NUCLEATED RBC'S   RBC 2.16 (*) 3.87 - 5.11 MIL/uL   Hemoglobin 6.5 (*) 12.0 - 15.0 g/dL   Comment: REPEATED TO VERIFY     DELTA CHECK NOTED     CRITICAL RESULT CALLED TO, READ BACK BY AND VERIFIED WITH:     DIANNA BEAVER RN.@0700  ON 1.17.15 BY CALDWELL T   HCT 19.0 (*) 36.0 - 46.0 %   MCV 88.0  78.0 - 100.0 fL   MCH 30.6  26.0 - 34.0 pg   MCHC 34.7  30.0 - 36.0 g/dL   RDW 40.915.2  81.111.5 - 91.415.5 %   Platelets 156  150 - 400 K/uL    Physical Exam:  Filed Vitals:   12/01/13 0725 12/01/13 0728 12/01/13 0730 12/01/13 0733  BP: 90/48 89/54 93/58  94/59  Pulse: 72 90 104 96  Temp:      TempSrc:      Resp: 16     Height:      Weight:      SpO2:       General: alert, cooperative and no distress  Heart: RRR  Lungs: CTA bilat  Breasts: Soft, nipples intact with slight redness.  Abd: Soft, NT with pos BS x 4 quads  Lochia: appropriate, sm rubra  Uterine Fundus: firm, NT 1 below umb  Incision: Perineum well approximated without redness  DVT Evaluation: No evidence of DVT seen  on physical exam.  Negative Homan's sign bilat.  1+ Calf/Ankle edema is present.  Discharge Diagnoses: Post-date pregnancy                                         Asymptomatic anemia                                        3rd degree perineal laceration  Discharge Information: Date: 12/01/2013 Activity: unrestricted Diet: routine Medications: PNV, Ibuprofen, Iron and Percocet.  OTC Colace/Pericolace Condition: stable Instructions: refer to practice specific booklet Discharge to: home Follow-up Information   Follow up with [facility]. Schedule an appointment as soon as possible for  a visit in 6 weeks.   Contact information:   [address], [address] [place] [address]      Contraception:  Condoms  Newborn Data: Live born female  Birth Weight: 6 lb 15.5 oz (3161 g) APGAR: 9, 9  Home with mother.  Wanda Reilly O. 12/01/2013, 12:47 PM

## 2013-12-01 NOTE — Lactation Note (Signed)
This note was copied from the chart of Wanda Birttany Shifflet. Lactation Consultation Note: Follow up assessment- Nurse midwife gave mom a NS due to sore nipples. Mom reports that baby fed well through the night. Then she slept for 4 hours and woke up very hungry. Mom reports lots of pain during that nursing so NS was given. Was instructed in proper way to apply NS. Mom reports that baby fed for 1 hour at 12:30 Encouraged to nurse on both breasts at each feeding. One small drop of breast milk noted in NS at this feeding. Mom reports that she still has pain with nursing but not as bad with NS. Encouraged to watch for breast milk in NS. Encouraged to use NS as temporary- to continue to try to latch without NS. To see Ped on Monday- [facility]   Patient Name: Wanda Reilly AYTKZ'SToday's Date: 12/01/2013 Reason for consult: Follow-up assessment   Maternal Data    Feeding Feeding Type: Breast Fed Length of feed: 5 min  LATCH Score/Interventions Latch: Grasps breast easily, tongue down, lips flanged, rhythmical sucking.  Audible Swallowing: A few with stimulation  Type of Nipple: Everted at rest and after stimulation  Comfort (Breast/Nipple): Filling, red/small blisters or bruises, mild/mod discomfort  Problem noted: Severe discomfort Interventions (Mild/moderate discomfort): Comfort gels  Hold (Positioning): Assistance needed to correctly position infant at breast and maintain latch.  LATCH Score: 7  Lactation Tools Discussed/Used Tools: Nipple Shields Nipple shield size: 24   Consult Status Consult Status: Complete    Pamelia HoitWeeks, Roshelle Traub D 12/01/2013, 1:56 PM

## 2013-12-01 NOTE — Progress Notes (Signed)
Post Partum Day 2 Subjective: Reports feeling ok.  Ambulating, voiding and tol po liquids and solids without difficulty.  Denies weakness or dizziness.  Reports significant nipple pain with breastfeeding.  Reports perineal pain controlled with medications.   Objective: Blood pressure 94/59, pulse 96, temperature 97.8 F (36.6 C), temperature source Oral, resp. rate 16, height 5\' 4"  (1.626 m), weight 63.957 kg (141 lb), last menstrual period 02/10/2013, SpO2 100.00%, unknown if currently breastfeeding. Filed Vitals:   12/01/13 0725 12/01/13 0728 12/01/13 0730 12/01/13 0733  BP: 90/48 89/54 93/58  94/59  Pulse: 72 90 104 96  Temp:      TempSrc:      Resp: 16     Height:      Weight:      SpO2:       Physical Exam:  General: alert, cooperative and no distress Heart:  RRR Lungs:  CTA bilat Breasts:  Soft, nipples intact with slight redness. Abd:  Soft, NT with pos BS x 4 quads Lochia: appropriate, sm rubra Uterine Fundus: firm, NT 1 below umb Incision: Perineum well approximated without redness DVT Evaluation: No evidence of DVT seen on physical exam. Negative Homan's sign bilat. 1+ Calf/Ankle edema is present.   Recent Labs  11/30/13 0543 12/01/13 0618  HGB 7.9* 6.5*  HCT 23.2* 19.0*    Assessment/Plan: Stable s/p vac assisted delivery @ 41w 5d 3rd degree perineal laceration Asymptomatic anemia Nipple pain with nursing  RBA blood transfusion d/w pt.  Pt declines due to being asymptomatic. Will discharge to home. Rx Percocet, Ibuprofen, Iron bid and rec OTC colace 1-2x/day and pericolace 1x/day. Relief of nipple pain d/w pt and nipple shields provided prior to discharge with improvement in nipple pain with nursing. Contraception:  Plans condoms. F/U [facility] 6wks.   LOS: 3 days   Val Farnam O. 12/01/2013, 1:02 PM

## 2013-12-01 NOTE — Discharge Instructions (Signed)
See practice brochure °

## 2013-12-01 NOTE — Lactation Note (Signed)
This note was copied from the chart of Girl Darryl Brusca. Lactation Consultation Note: Follow up visit with mom before DC. Mom reports that baby has been cluster feeding through the night. Baby awake and fussy. Assisted mom with latch. Mom reports some tenderness when baby first latched on then eases off. Nipples are pink but intact. Mom has comfort gels but has not used them yet. Encouraged to run EBM into nipples after nursing and to use comfort gels. Mom asking about mastitis- signs of mastitis reviewed with mom and encouraged to call OB if she notices any signs of mastitis. No further questions at present. Reviewed BFS[facility] and OP appointments as resources for support after DC. To call prn  Patient Name: Girl Theophilus BonesKamali Tirado ZOXWR'UToday's Date: 12/01/2013 Reason for consult: Follow-up assessment   Maternal Data    Feeding Feeding Type: Breast Fed Length of feed: 15 min  LATCH Score/Interventions Latch: Grasps breast easily, tongue down, lips flanged, rhythmical sucking.  Audible Swallowing: A few with stimulation  Type of Nipple: Everted at rest and after stimulation  Comfort (Breast/Nipple): Filling, red/small blisters or bruises, mild/mod discomfort  Problem noted: Mild/Moderate discomfort Interventions (Mild/moderate discomfort): Comfort gels  Hold (Positioning): Assistance needed to correctly position infant at breast and maintain latch. Intervention(s): Breastfeeding basics reviewed;Support Pillows;Position options  LATCH Score: 7  Lactation Tools Discussed/Used     Consult Status Consult Status: Complete    Pamelia HoitWeeks, Elzora Cullins D 12/01/2013, 8:17 AM

## 2014-09-16 ENCOUNTER — Encounter (HOSPITAL_COMMUNITY): Payer: Self-pay

## 2016-10-05 LAB — OB RESULTS CONSOLE GC/CHLAMYDIA
CHLAMYDIA, DNA PROBE: NEGATIVE
GC PROBE AMP, GENITAL: NEGATIVE

## 2016-10-05 LAB — OB RESULTS CONSOLE RPR: RPR: NONREACTIVE

## 2016-10-05 LAB — OB RESULTS CONSOLE HEPATITIS B SURFACE ANTIGEN: HEP B S AG: NEGATIVE

## 2016-10-05 LAB — OB RESULTS CONSOLE ABO/RH: RH TYPE: NEGATIVE

## 2016-10-05 LAB — OB RESULTS CONSOLE HIV ANTIBODY (ROUTINE TESTING): HIV: NONREACTIVE

## 2016-10-05 LAB — OB RESULTS CONSOLE GBS: STREP GROUP B AG: POSITIVE

## 2016-10-05 LAB — OB RESULTS CONSOLE RUBELLA ANTIBODY, IGM: RUBELLA: IMMUNE

## 2016-10-05 LAB — OB RESULTS CONSOLE ANTIBODY SCREEN: Antibody Screen: NEGATIVE

## 2016-11-15 NOTE — L&D Delivery Note (Signed)
Delivery Note  At 6:47 PM a viable female was delivered via Vaginal, Spontaneous Delivery (Presentation: ROP ).  APGAR: 8, 9; weight pending Placenta spontaneous, complete, 3 Vx cord  Anesthesia:  epidural Episiotomy: None Lacerations: 2nd degree;Perineal Suture Repair: 3.0 monocryl Est. Blood Loss (mL):  500  Mom to postpartum.  Baby to Couplet care / Skin to Skin.  Wanda Reilly A 05/31/2017, 7:25 PM

## 2017-04-30 ENCOUNTER — Encounter (HOSPITAL_COMMUNITY): Payer: Self-pay | Admitting: *Deleted

## 2017-04-30 ENCOUNTER — Inpatient Hospital Stay (HOSPITAL_COMMUNITY): Payer: BLUE CROSS/BLUE SHIELD

## 2017-04-30 ENCOUNTER — Inpatient Hospital Stay (HOSPITAL_COMMUNITY)
Admission: AD | Admit: 2017-04-30 | Discharge: 2017-04-30 | Disposition: A | Discharge status: Home / Self Care | Payer: BLUE CROSS/BLUE SHIELD | Source: Ambulatory Visit | Attending: Obstetrics & Gynecology | Admitting: Obstetrics & Gynecology

## 2017-04-30 DIAGNOSIS — Z6791 Unspecified blood type, Rh negative: Secondary | ICD-10-CM

## 2017-04-30 DIAGNOSIS — O26899 Other specified pregnancy related conditions, unspecified trimester: Secondary | ICD-10-CM

## 2017-04-30 DIAGNOSIS — Z3A36 36 weeks gestation of pregnancy: Secondary | ICD-10-CM | POA: Diagnosis not present

## 2017-04-30 DIAGNOSIS — O4693 Antepartum hemorrhage, unspecified, third trimester: Secondary | ICD-10-CM

## 2017-04-30 DIAGNOSIS — O26893 Other specified pregnancy related conditions, third trimester: Secondary | ICD-10-CM | POA: Diagnosis not present

## 2017-04-30 DIAGNOSIS — O99820 Streptococcus B carrier state complicating pregnancy: Secondary | ICD-10-CM | POA: Insufficient documentation

## 2017-04-30 DIAGNOSIS — R8271 Bacteriuria: Secondary | ICD-10-CM | POA: Diagnosis present

## 2017-04-30 HISTORY — DX: Anemia, unspecified: D64.9

## 2017-04-30 LAB — URINALYSIS, ROUTINE W REFLEX MICROSCOPIC
Bilirubin Urine: NEGATIVE
GLUCOSE, UA: NEGATIVE mg/dL
Ketones, ur: NEGATIVE mg/dL
Leukocytes, UA: NEGATIVE
NITRITE: NEGATIVE
Protein, ur: NEGATIVE mg/dL
SPECIFIC GRAVITY, URINE: 1.013 (ref 1.005–1.030)
pH: 6 (ref 5.0–8.0)

## 2017-04-30 LAB — RH IG WORKUP (INCLUDES ABO/RH)
ABO/RH(D): O NEG
Antibody Screen: POSITIVE
FETAL SCREEN: NEGATIVE
Gestational Age(Wks): 36.4
Unit division: 0

## 2017-04-30 LAB — CBC
HCT: 37.7 % (ref 36.0–46.0)
Hemoglobin: 12.9 g/dL (ref 12.0–15.0)
MCH: 30.1 pg (ref 26.0–34.0)
MCHC: 34.2 g/dL (ref 30.0–36.0)
MCV: 87.9 fL (ref 78.0–100.0)
PLATELETS: 188 10*3/uL (ref 150–400)
RBC: 4.29 MIL/uL (ref 3.87–5.11)
RDW: 14.6 % (ref 11.5–15.5)
WBC: 11.9 10*3/uL — ABNORMAL HIGH (ref 4.0–10.5)

## 2017-04-30 IMAGING — US US MFM OB LIMITED
1 series · 12 of 27 positions shown · non-contrast
Comparison: none

[Series 1: us mfm ob limited · 27 acquisitions, 12 frames shown]
[im 2/27]
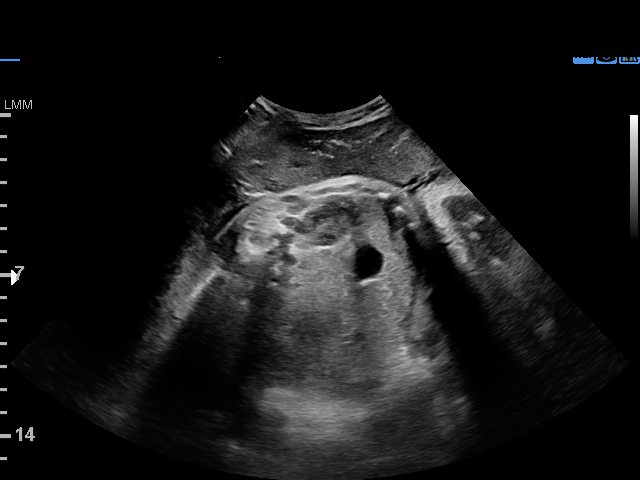
[im 4/27]
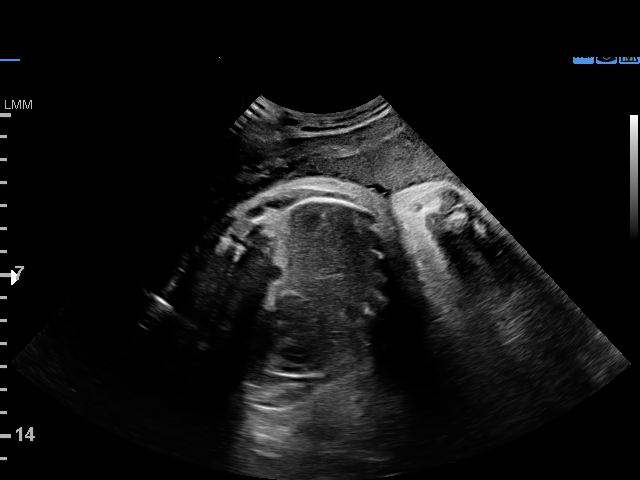
[im 6/27]
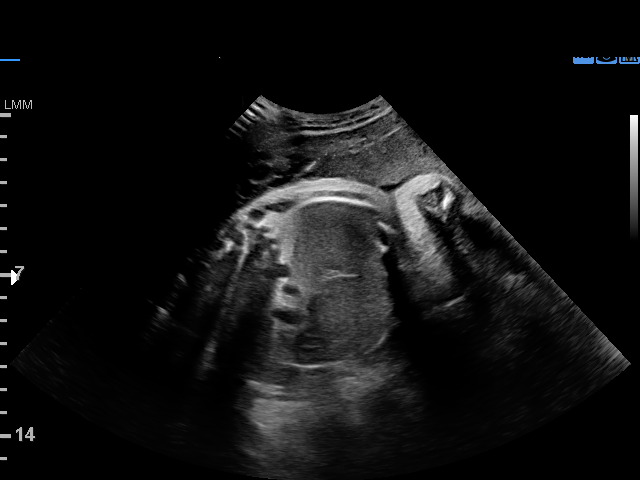
[im 8/27]
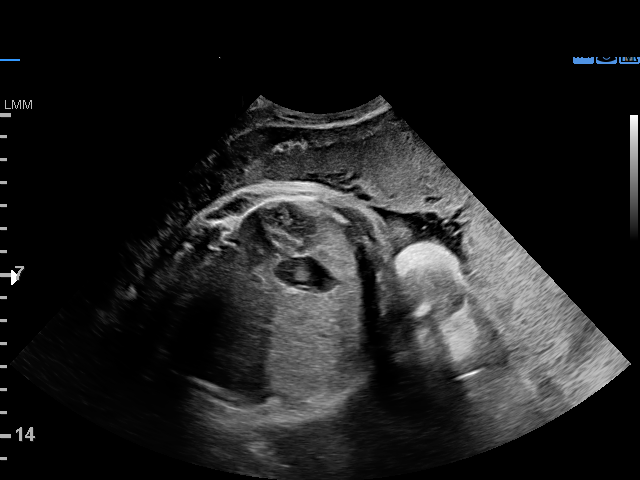
[im 11/27]
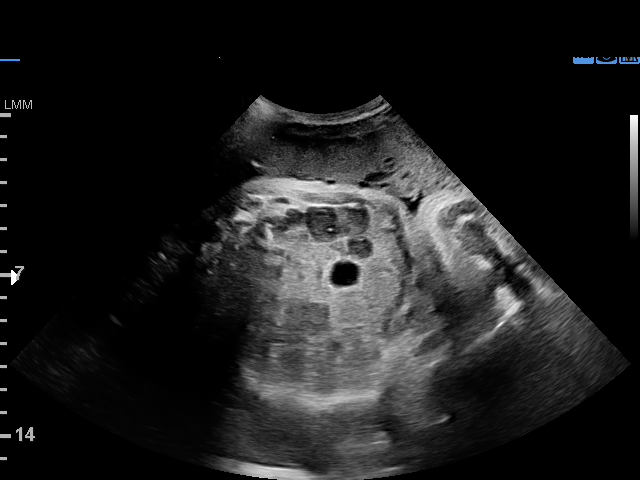
[im 13/27]
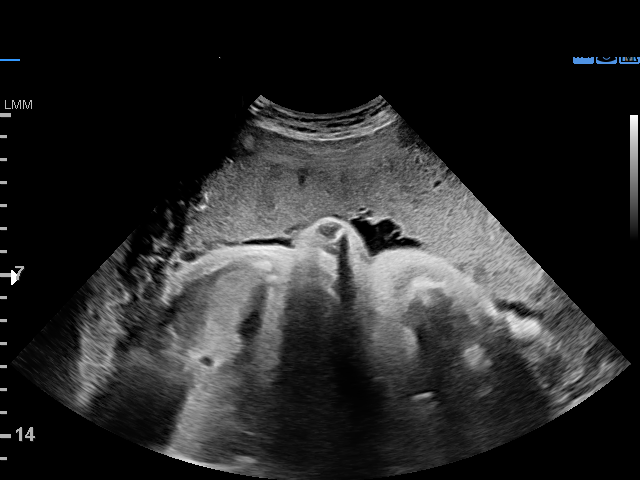
[im 15/27]
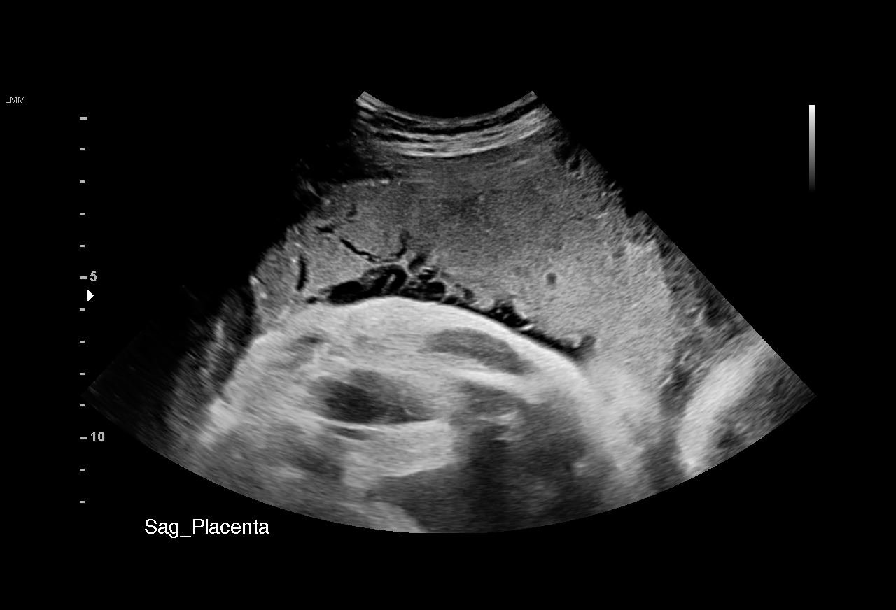
[im 17/27]
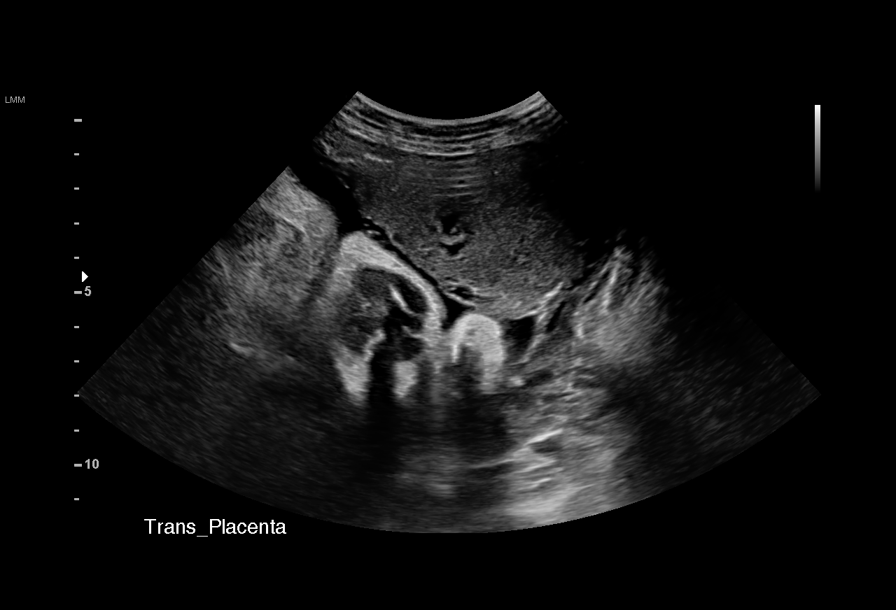
[im 20/27]
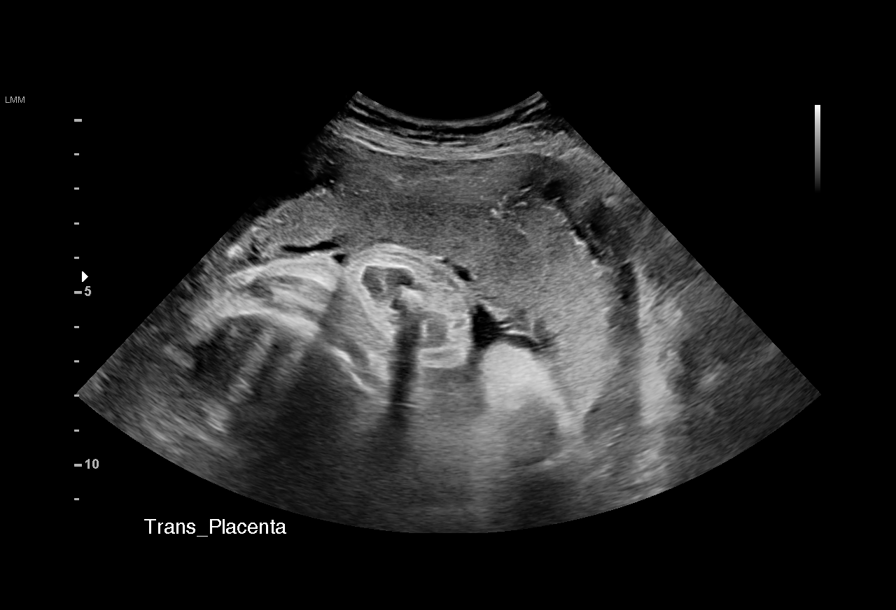
[im 22/27]
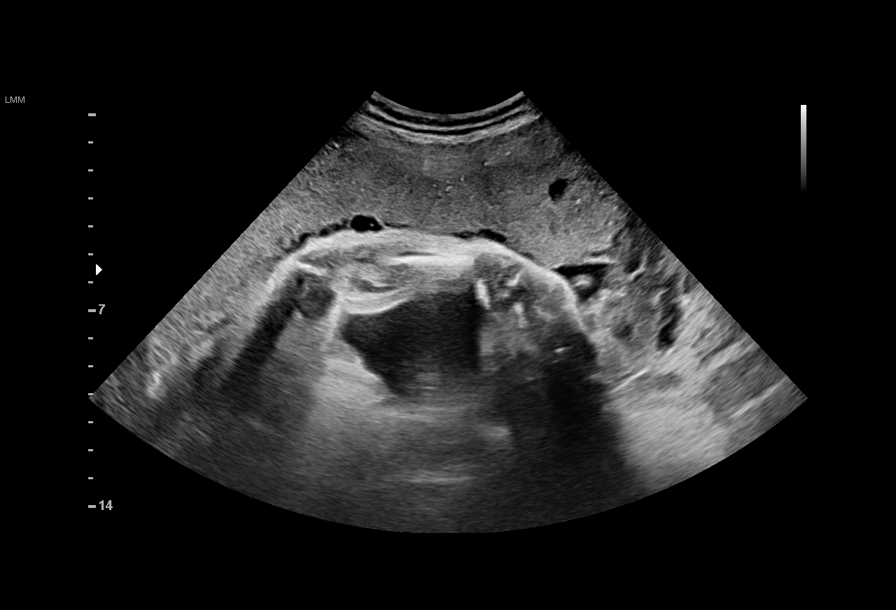
[im 24/27]
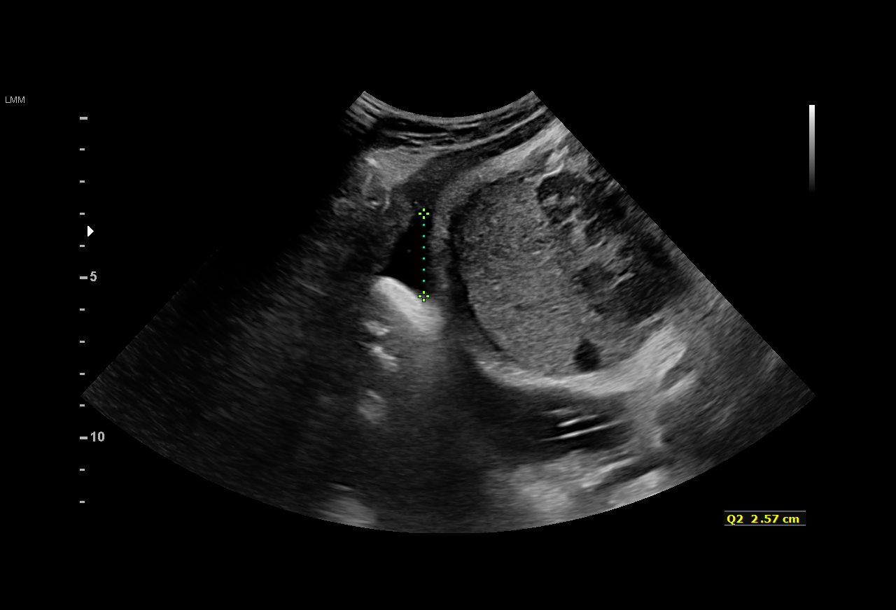
[im 26/27]
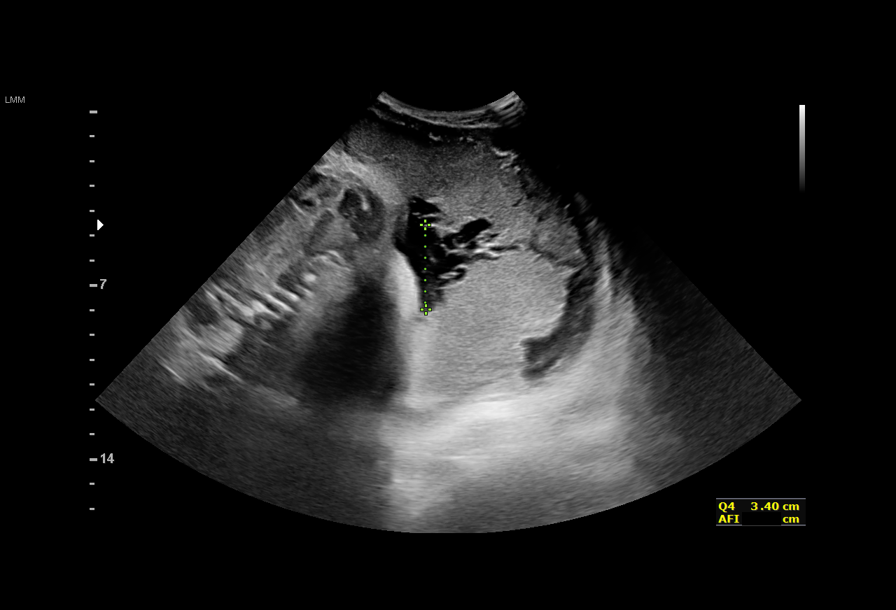

[12 of 27 positions shown; findings below may reference images not displayed]

Gynecology
[id number]
[place]
Attending:         Raynard Lukas         Secondary Phy.:    [facility]-
MAU/Triage

1  ANGIE KARINA SANTIESTEBAN              [id number]      [id number]     [id number]
2  ANGIE KARINA SANTIESTEBAN              [id number]      [id number]     [id number]
Indications

36 weeks gestation of pregnancy
Vaginal bleeding in pregnancy, third trimester
OB History

Gravidity:     2         Term:  1
Living:        1
Fetal Evaluation

Num Of Fetuses:      1
Fetal Heart          150
Rate(bpm):
Cardiac Activity:    Observed
Presentation:        Cephalic
Placenta:            Anterior, above cervical os

Amniotic Fluid
AFI FV:      Subjectively within normal limits

AFI Sum(cm)     %Tile       Largest Pocket(cm)
15.34           57
RUQ(cm)       RLQ(cm)        LUQ(cm)        LLQ(cm)
4.53

Comment:     No definite evidence or placental abruption or previa.
Biophysical Evaluation

Amniotic F.V:   Pocket => 2 cm two          F. Tone:         Observed
planes
F. Movement:    Observed                    Score:           [DATE]
F. Breathing:   Observed
Gestational Age

Clinical EDD:   36w 4d                                         EDD:  05/24/17
Best:           36w 4d    Det. By:  Clinical EDD               EDD:  05/24/17
Impression

Singleton intrauterine pregnancy at 36 weeks 4 days gestation
with fetal cardiac activity
Cephalic presentation
BPP [DATE] with an AFI >15cm
No sonographic evidence of intrauterine bleed

## 2017-04-30 NOTE — MAU Note (Signed)
Pt presents to MAU with complaints of lower back pain since early this morning Reports she noticed dark brown blood on her panty liner at 3 am this morning. Last intercourse when she conceived. Reports scant amount of bleeding in restroom here

## 2017-04-30 NOTE — MAU Provider Note (Signed)
History   41 yo G2P1001 at 3836 4/7 weeks presented after calling with episode of vaginal bleeding during the night.  Denies pain, reports +FM, "but not as much as usual", no IC since early pregnancy, no recent cervical exam.  Bleeding has been a small amount, some bright red, some darker.  Only had small smear upon arrival with wiping.  Rh negative, received Rhophylac 03/09/17.   Patient Active Problem List   Diagnosis Date Noted  . GBS bacteriuria 04/30/2017  . Rh negative state in antepartum period 04/30/2017  . Third degree laceration of perineum--2015 12/01/2013  . Irregular menses     Chief Complaint  Patient presents with  . Vaginal Bleeding   HPI:  As above  OB History    Gravida Para Term Preterm AB Living   2 1 1  0 0 1   SAB TAB Ectopic Multiple Live Births   0 0 0 0 1    2015--VE assisted delivery, 41 5/7 weeks, female, 6+15, epidural, delivered by Dr. Su Hiltoberts, 19 hour labor.  Past Medical History:  Diagnosis Date  . Alopecia   . Anemia   . Anovulation   . GERD (gastroesophageal reflux disease)   . H/O varicella   . Irregular menses   . Ovarian cyst   . PCOS (polycystic ovarian syndrome)   . Vitamin D deficiency     Past Surgical History:  Procedure Laterality Date  . NO PAST SURGERIES    . SKIN CANCER EXCISION      Family History  Problem Relation Age of Onset  . Hypertension Mother     Social History  Substance Use Topics  . Smoking status: Never Smoker  . Smokeless tobacco: Never Used  . Alcohol use No    Allergies:  Allergies  Allergen Reactions  . Iodine Other (See Comments)    unknown  . Lodine [Etodolac] Swelling    Prescriptions Prior to Admission  Medication Sig Dispense Refill Last Dose  . Cholecalciferol (VITAMIN D3) LIQD Take 4,000 Int'l Units by mouth daily.   11/27/2013 at Unknown time  . ferrous sulfate 325 (65 FE) MG tablet Take 1 tablet (325 mg total) by mouth 2 (two) times daily with a meal. 60 tablet 1   . ibuprofen  (ADVIL,MOTRIN) 600 MG tablet Take 1 tablet (600 mg total) by mouth every 6 (six) hours. 60 tablet 1   . oxyCODONE-acetaminophen (PERCOCET/ROXICET) 5-325 MG per tablet Take 1-2 tablets by mouth every 4 (four) hours as needed for severe pain (moderate - severe pain). 30 tablet 0   . Prenatal Vit-Fe Fumarate-FA (PRENATAL MULTIVITAMIN) TABS tablet Take 1 tablet by mouth daily.   11/27/2013 at Unknown time    ROS:  Small amount vaginal bleeding, +FM, mild low back pain. Physical Exam   Blood pressure 121/73, pulse (!) 117, temperature 97.9 F (36.6 C), resp. rate 18, height 5\' 4"  (1.626 m), weight 64.4 kg (142 lb), last menstrual period 08/17/2016, unknown if currently breastfeeding.    Physical Exam  In NAD, but anxious Chest clear Heart RRR without murmur Abd gravid, NT Pelvic--speculum exam shows cervix friable, very soft, posterior, loose 1 cm, 50%, vtx, -1, no evidence of blood coming from os. Ext WNL  FHR Category 1 Mild irritability, one UC, but patient unaware   ED Course  Assessment: IUP at 36 4/7 weeks Vaginal bleeding, cervix friability GBS positive FHR Category 1  Plan: Observe for EFM and bleeding. BPP/AFI. Rhophylac w/u and injection prn, CBC   Ermie Glendenning  CNM, MSN 04/30/2017 9:04 AM   Addendum: Results for orders placed or performed during the hospital encounter of 04/30/17 (from the past 24 hour(s))  Urinalysis, Routine w reflex microscopic     Status: Abnormal   Collection Time: 04/30/17  8:15 AM  Result Value Ref Range   Color, Urine YELLOW YELLOW   APPearance CLEAR CLEAR   Specific Gravity, Urine 1.013 1.005 - 1.030   pH 6.0 5.0 - 8.0   Glucose, UA NEGATIVE NEGATIVE mg/dL   Hgb urine dipstick MODERATE (A) NEGATIVE   Bilirubin Urine NEGATIVE NEGATIVE   Ketones, ur NEGATIVE NEGATIVE mg/dL   Protein, ur NEGATIVE NEGATIVE mg/dL   Nitrite NEGATIVE NEGATIVE   Leukocytes, UA NEGATIVE NEGATIVE   RBC / HPF 0-5 0 - 5 RBC/hpf   WBC, UA 0-5 0 - 5 WBC/hpf    Bacteria, UA RARE (A) NONE SEEN   Squamous Epithelial / LPF 0-5 (A) NONE SEEN   Mucous PRESENT   Rh IG workup (includes ABO/Rh)     Status: None (Preliminary result)   Collection Time: 04/30/17  9:19 AM  Result Value Ref Range   Gestational Age(Wks) 36.4    ABO/RH(D) O NEG    Antibody Screen POS    Fetal Screen NEG    Antibody Identification PASSIVELY ACQUIRED ANTI-D    Unit Number [id number]    Blood Component Type RHIG    Unit division 00    Status of Unit ALLOCATED    Transfusion Status OK TO TRANSFUSE   CBC     Status: Abnormal   Collection Time: 04/30/17  9:19 AM  Result Value Ref Range   WBC 11.9 (H) 4.0 - 10.5 K/uL   RBC 4.29 3.87 - 5.11 MIL/uL   Hemoglobin 12.9 12.0 - 15.0 g/dL   HCT 11.9 14.7 - 82.9 %   MCV 87.9 78.0 - 100.0 fL   MCH 30.1 26.0 - 34.0 pg   MCHC 34.2 30.0 - 36.0 g/dL   RDW 56.2 13.0 - 86.5 %   Platelets 188 150 - 400 K/uL   No need for Rhophylac due to positive antibody screen from past Rhophylac.  Returned from [place]:  BPP 8/8, AFI 15.34, 57%ile, vtx, no evidence of placental abruption.  FHR Category 1 Irregular, mild UCs, some irritability, patient unaware of most.  Plan: D/C home with bleeding precautions. Keep scheduled visit on 6/20, call prn with any questions or concerns. Patient reassured regarding maternal/fetal status.

## 2017-04-30 NOTE — Discharge Instructions (Signed)
Vaginal Bleeding During Pregnancy, Third Trimester °A small amount of bleeding (spotting) from the vagina is relatively common in pregnancy. Various things can cause bleeding or spotting in pregnancy. Sometimes the bleeding is normal and is not a problem. However, bleeding during the third trimester can also be a sign of something serious for the mother and the baby. Be sure to tell your health care provider about any vaginal bleeding right away. °Some possible causes of vaginal bleeding during the third trimester include: °· The placenta may be partially or completely covering the opening to the cervix (placenta previa). °· The placenta may have separated from the uterus (abruption of the placenta). °· There may be an infection or growth on the cervix. °· You may be starting labor, called discharging of the mucus plug. °· The placenta may grow into the muscle layer of the uterus (placenta accreta). ° °Follow these instructions at home: °Watch your condition for any changes. The following actions may help to lessen any discomfort you are feeling: °· Follow your health care provider's instructions for limiting your activity. If your health care provider orders bed rest, you may need to stay in bed and only get up to use the bathroom. However, your health care provider may allow you to continue light activity. °· If needed, make plans for someone to help with your regular activities and responsibilities while you are on bed rest. °· Keep track of the number of pads you use each day, how often you change pads, and how soaked (saturated) they are. Write this down. °· Do not use tampons. Do not douche. °· Do not have sexual intercourse or orgasms until approved by your health care provider. °· Follow your health care provider's advice about lifting, driving, and physical activities. °· If you pass any tissue from your vagina, save the tissue so you can show it to your health care provider. °· Only take over-the-counter  or prescription medicines as directed by your health care provider. °· Do not take aspirin because it can make you bleed. °· Keep all follow-up appointments as directed by your health care provider. ° °Contact a health care provider if: °· You have any vaginal bleeding during any part of your pregnancy. °· You have cramps or labor pains. °· You have a fever, not controlled by medicine. °Get help right away if: °· You have severe cramps or pain in your back or belly (abdomen). °· You have chills. °· You have a gush of fluid from the vagina. °· You pass large clots or tissue from your vagina. °· Your bleeding increases. °· You feel light-headed or weak. °· You pass out. °· You feel less movement or no movement of the baby. °This information is not intended to replace advice given to you by your health care provider. Make sure you discuss any questions you have with your health care provider. °Document Released: 01/22/2003 Document Revised: 04/08/2016 Document Reviewed: 07/09/2013 °Elsevier Interactive Patient Education © 2018 [employer] ° °

## 2017-05-25 ENCOUNTER — Other Ambulatory Visit: Payer: Self-pay | Admitting: Obstetrics and Gynecology

## 2017-05-26 ENCOUNTER — Telehealth (HOSPITAL_COMMUNITY): Payer: Self-pay | Admitting: *Deleted

## 2017-05-26 ENCOUNTER — Encounter (HOSPITAL_COMMUNITY): Payer: Self-pay | Admitting: *Deleted

## 2017-05-26 NOTE — Telephone Encounter (Signed)
Preadmission screen  

## 2017-05-31 ENCOUNTER — Inpatient Hospital Stay (HOSPITAL_COMMUNITY): Payer: BLUE CROSS/BLUE SHIELD | Admitting: Anesthesiology

## 2017-05-31 ENCOUNTER — Encounter (HOSPITAL_COMMUNITY): Payer: Self-pay

## 2017-05-31 ENCOUNTER — Inpatient Hospital Stay (HOSPITAL_COMMUNITY)
Admission: RE | Admit: 2017-05-31 | Discharge: 2017-06-02 | DRG: 775 | Disposition: A | Discharge status: Home / Self Care | Payer: BLUE CROSS/BLUE SHIELD | Source: Ambulatory Visit | Attending: Obstetrics and Gynecology | Admitting: Obstetrics and Gynecology

## 2017-05-31 DIAGNOSIS — Z3A41 41 weeks gestation of pregnancy: Secondary | ICD-10-CM

## 2017-05-31 DIAGNOSIS — O26893 Other specified pregnancy related conditions, third trimester: Secondary | ICD-10-CM | POA: Diagnosis present

## 2017-05-31 DIAGNOSIS — O99824 Streptococcus B carrier state complicating childbirth: Secondary | ICD-10-CM | POA: Diagnosis present

## 2017-05-31 DIAGNOSIS — Z6791 Unspecified blood type, Rh negative: Secondary | ICD-10-CM | POA: Diagnosis not present

## 2017-05-31 DIAGNOSIS — O480 Post-term pregnancy: Principal | ICD-10-CM | POA: Diagnosis present

## 2017-05-31 LAB — CBC
HEMATOCRIT: 38.8 % (ref 36.0–46.0)
HEMOGLOBIN: 13.2 g/dL (ref 12.0–15.0)
MCH: 29.9 pg (ref 26.0–34.0)
MCHC: 34 g/dL (ref 30.0–36.0)
MCV: 88 fL (ref 78.0–100.0)
Platelets: 158 10*3/uL (ref 150–400)
RBC: 4.41 MIL/uL (ref 3.87–5.11)
RDW: 15.3 % (ref 11.5–15.5)
WBC: 10.6 10*3/uL — ABNORMAL HIGH (ref 4.0–10.5)

## 2017-05-31 MED ORDER — TERBUTALINE SULFATE 1 MG/ML IJ SOLN
0.2500 mg | Freq: Once | INTRAMUSCULAR | Status: DC | PRN
  Filled 2017-05-31: qty 1

## 2017-05-31 MED ORDER — PHENYLEPHRINE 40 MCG/ML (10ML) SYRINGE FOR IV PUSH (FOR BLOOD PRESSURE SUPPORT): 80.0000 ug | PREFILLED_SYRINGE | INTRAVENOUS | Status: DC | PRN

## 2017-05-31 MED ORDER — EPHEDRINE 5 MG/ML INJ
10.0000 mg | INTRAVENOUS | Status: DC | PRN
  Filled 2017-05-31: qty 2

## 2017-05-31 MED ORDER — WITCH HAZEL-GLYCERIN EX PADS: 1.0000 "application " | MEDICATED_PAD | CUTANEOUS | Status: DC | PRN

## 2017-05-31 MED ORDER — EPHEDRINE 5 MG/ML INJ: 10.0000 mg | INTRAVENOUS | Status: DC | PRN

## 2017-05-31 MED ORDER — ACETAMINOPHEN 325 MG PO TABS: 650.0000 mg | ORAL_TABLET | ORAL | Status: DC | PRN

## 2017-05-31 MED ORDER — TETANUS-DIPHTH-ACELL PERTUSSIS 5-2.5-18.5 LF-MCG/0.5 IM SUSP: 0.5000 mL | Freq: Once | INTRAMUSCULAR | Status: DC

## 2017-05-31 MED ORDER — EPHEDRINE 5 MG/ML INJ
10.0000 mg | INTRAVENOUS | Status: DC | PRN
Start: 2017-05-31 — End: 2017-05-31
  Filled 2017-05-31: qty 2

## 2017-05-31 MED ORDER — LACTATED RINGERS IV SOLN
INTRAVENOUS | Status: DC
  Administered 2017-05-31 (×3): via INTRAVENOUS

## 2017-05-31 MED ORDER — SIMETHICONE 80 MG PO CHEW: 80.0000 mg | CHEWABLE_TABLET | ORAL | Status: DC | PRN

## 2017-05-31 MED ORDER — ZOLPIDEM TARTRATE 5 MG PO TABS: 5.0000 mg | ORAL_TABLET | Freq: Every evening | ORAL | Status: DC | PRN

## 2017-05-31 MED ORDER — PENICILLIN G POT IN DEXTROSE 60000 UNIT/ML IV SOLN
3.0000 10*6.[IU] | INTRAVENOUS | Status: DC
  Administered 2017-05-31: 3 10*6.[IU] via INTRAVENOUS
  Filled 2017-05-31 (×4): qty 50

## 2017-05-31 MED ORDER — PHENYLEPHRINE 40 MCG/ML (10ML) SYRINGE FOR IV PUSH (FOR BLOOD PRESSURE SUPPORT)
80.0000 ug | PREFILLED_SYRINGE | INTRAVENOUS | Status: DC | PRN
  Administered 2017-05-31 (×2): 80 ug via INTRAVENOUS
  Filled 2017-05-31: qty 5

## 2017-05-31 MED ORDER — COCONUT OIL OIL
1.0000 "application " | TOPICAL_OIL | Status: DC | PRN
  Administered 2017-06-02: 1 via TOPICAL
  Filled 2017-05-31: qty 120

## 2017-05-31 MED ORDER — DIBUCAINE 1 % RE OINT: 1.0000 "application " | TOPICAL_OINTMENT | RECTAL | Status: DC | PRN

## 2017-05-31 MED ORDER — FENTANYL CITRATE (PF) 100 MCG/2ML IJ SOLN: 50.0000 ug | INTRAMUSCULAR | Status: DC | PRN

## 2017-05-31 MED ORDER — ONDANSETRON HCL 4 MG/2ML IJ SOLN: 4.0000 mg | Freq: Four times a day (QID) | INTRAMUSCULAR | Status: DC | PRN

## 2017-05-31 MED ORDER — LACTATED RINGERS IV SOLN: 500.0000 mL | INTRAVENOUS | Status: DC | PRN

## 2017-05-31 MED ORDER — BENZOCAINE-MENTHOL 20-0.5 % EX AERO
1.0000 "application " | INHALATION_SPRAY | CUTANEOUS | Status: DC | PRN
  Administered 2017-06-01: 1 via TOPICAL
  Filled 2017-05-31: qty 56

## 2017-05-31 MED ORDER — LIDOCAINE HCL (PF) 1 % IJ SOLN
INTRAMUSCULAR | Status: DC | PRN
  Administered 2017-05-31 (×2): 5 mL via EPIDURAL

## 2017-05-31 MED ORDER — PRENATAL MULTIVITAMIN CH
1.0000 | ORAL_TABLET | Freq: Every day | ORAL | Status: DC
  Administered 2017-06-01 – 2017-06-02 (×2): 1 via ORAL
  Filled 2017-05-31 (×2): qty 1

## 2017-05-31 MED ORDER — SOD CITRATE-CITRIC ACID 500-334 MG/5ML PO SOLN: 30.0000 mL | ORAL | Status: DC | PRN

## 2017-05-31 MED ORDER — FLEET ENEMA 7-19 GM/118ML RE ENEM: 1.0000 | ENEMA | RECTAL | Status: DC | PRN

## 2017-05-31 MED ORDER — ONDANSETRON HCL 4 MG PO TABS: 4.0000 mg | ORAL_TABLET | ORAL | Status: DC | PRN

## 2017-05-31 MED ORDER — IBUPROFEN 600 MG PO TABS
600.0000 mg | ORAL_TABLET | Freq: Four times a day (QID) | ORAL | Status: DC
  Administered 2017-05-31 – 2017-06-02 (×7): 600 mg via ORAL
  Filled 2017-05-31 (×7): qty 1

## 2017-05-31 MED ORDER — ONDANSETRON HCL 4 MG/2ML IJ SOLN: 4.0000 mg | INTRAMUSCULAR | Status: DC | PRN

## 2017-05-31 MED ORDER — MISOPROSTOL 25 MCG QUARTER TABLET: 25.0000 ug | ORAL_TABLET | ORAL | Status: DC | PRN

## 2017-05-31 MED ORDER — LACTATED RINGERS IV SOLN: 500.0000 mL | Freq: Once | INTRAVENOUS | Status: DC

## 2017-05-31 MED ORDER — PHENYLEPHRINE 40 MCG/ML (10ML) SYRINGE FOR IV PUSH (FOR BLOOD PRESSURE SUPPORT)
80.0000 ug | PREFILLED_SYRINGE | INTRAVENOUS | Status: DC | PRN
  Filled 2017-05-31: qty 10
  Filled 2017-05-31: qty 5

## 2017-05-31 MED ORDER — DIPHENHYDRAMINE HCL 50 MG/ML IJ SOLN: 12.5000 mg | INTRAMUSCULAR | Status: DC | PRN

## 2017-05-31 MED ORDER — DIPHENHYDRAMINE HCL 25 MG PO CAPS: 25.0000 mg | ORAL_CAPSULE | Freq: Four times a day (QID) | ORAL | Status: DC | PRN

## 2017-05-31 MED ORDER — OXYTOCIN 40 UNITS IN LACTATED RINGERS INFUSION - SIMPLE MED
1.0000 m[IU]/min | INTRAVENOUS | Status: DC
  Administered 2017-05-31: 1 m[IU]/min via INTRAVENOUS
  Filled 2017-05-31: qty 1000

## 2017-05-31 MED ORDER — OXYTOCIN 40 UNITS IN LACTATED RINGERS INFUSION - SIMPLE MED: 2.5000 [IU]/h | INTRAVENOUS | Status: DC

## 2017-05-31 MED ORDER — PENICILLIN G POTASSIUM 5000000 UNITS IJ SOLR
5.0000 10*6.[IU] | Freq: Once | INTRAVENOUS | Status: AC
  Administered 2017-05-31: 5 10*6.[IU] via INTRAVENOUS
  Filled 2017-05-31: qty 5

## 2017-05-31 MED ORDER — LIDOCAINE HCL (PF) 1 % IJ SOLN
30.0000 mL | INTRAMUSCULAR | Status: DC | PRN
  Filled 2017-05-31: qty 30

## 2017-05-31 MED ORDER — FENTANYL 2.5 MCG/ML BUPIVACAINE 1/10 % EPIDURAL INFUSION (WH - ANES)
14.0000 mL/h | INTRAMUSCULAR | Status: DC | PRN
  Administered 2017-05-31: 14 mL/h via EPIDURAL
  Filled 2017-05-31: qty 100

## 2017-05-31 MED ORDER — SENNOSIDES-DOCUSATE SODIUM 8.6-50 MG PO TABS
2.0000 | ORAL_TABLET | ORAL | Status: DC
  Administered 2017-05-31 – 2017-06-02 (×2): 2 via ORAL
  Filled 2017-05-31 (×2): qty 2

## 2017-05-31 MED ORDER — OXYTOCIN BOLUS FROM INFUSION: 500.0000 mL | Freq: Once | INTRAVENOUS | Status: DC

## 2017-05-31 NOTE — Anesthesia Pain Management Evaluation Note (Signed)
  [facility] Pain Management Visit Note  Patient: Wanda Reilly, 41 y.o., female  "Hello [facility] am a [facility]. We have an anesthesia team available at all times to provide care throughout the hospital, including epidural management and anesthesia for C-section. I don't know your plan for the delivery whether it a natural birth, water birth, IV sedation, nitrous supplementation, doula or epidural, but we want to meet your pain goals."   1.Was your pain managed to your expectations on prior hospitalizations?   Yes   2.What is your expectation for pain management during this hospitalization?     Epidural  3.How can we help you reach that goal? unsure  Record the patient's initial score and the patient's pain goal.   Pain: 0  Pain Goal: 4 [facility] wants you to be able to say your pain was always managed very well.  Cephus ShellingBURGER,Wanda Reilly 05/31/2017

## 2017-05-31 NOTE — H&P (Signed)
Wanda Reilly is a [age] y.o. female , G3P1 presenting at 41 weeks for induction of labor.  Pregnancy followed at [facility] since 7  weeks and remarkable for:  1. AMA: normal Panorama, AFP and anatomy survey 2. Rh-: Rhogam received at 30 weeks 3. GBS in NOB urine culture. Treated. 4. 1st delivery with vacuum assistance, 6 lbs 15 oz  and 3rd degree laceration  OB History    Gravida Para Term Preterm AB Living   3 1 1  0 1 1   SAB TAB Ectopic Multiple Live Births   1 0 0 0 1     Past Medical History:  Diagnosis Date  . Alopecia   . Anemia   . Anovulation   . GERD (gastroesophageal reflux disease)   . H/O varicella   . Irregular menses   . Ovarian cyst   . PCOS (polycystic ovarian syndrome)   . Vitamin D deficiency    Past Surgical History:  Procedure Laterality Date  . NO PAST SURGERIES    . SKIN CANCER EXCISION      Family History:   family history includes Hypertension in her mother. Social History:    reports that she has never smoked. She has never used smokeless tobacco. She reports that she does not drink alcohol or use drugs.   Prenatal labs: ABO, Rh: O NEG (07/17 0745) Antibody: POS (07/17 0745) Rubella: immune RPR: Nonreactive (11/21 0000)  HBsAg: Negative (11/21 0000)  HIV: Non-reactive (11/21 0000)  GBS: Positive (11/21 0000)    Prenatal Transfer Tool  Maternal Diabetes: No Genetic Screening: Normal Maternal Ultrasounds/Referrals: Normal Fetal Ultrasounds or other Referrals:  None Maternal Substance Abuse:  No Significant Maternal Medications:  None Significant Maternal Lab Results: None   Dilation: 4 Effacement (%): 50 Station: -2 Exam by:: J.Cox, RN Blood pressure 97/68, pulse 78, temperature 98.2 F (36.8 C), temperature source Oral, height 5\' 4"  (1.626 m), weight 66.7 kg (147 lb), last menstrual period 08/17/2016, unknown if currently breastfeeding.  General Appearance: Alert, appropriate appearance for age. No acute distress HEENT Exam:  Grossly normal Chest/Respiratory Exam: Normal chest wall and respirations. Clear to auscultation Cardiovascular Exam: Regular rate and rhythm. S1, S2, no murmur Gastrointestinal Exam: soft, non-tender, Uterus gravid with size compatible with GA, Vertex presentation by Leopold's maneuvers Psychiatric Exam: Alert and oriented, appropriate affect  ++++++++++++++++++++++++++++++++++++++++++++++++++++++++++++++++  Fetal tracings: Category 1  ++++++++++++++++++++++++++++++++++++++++++++++++++++++++++++++++   Assessment/Plan:  SIUP at 41 weeks for induction of labor GBS prophylaxis SVD expected   Silverio LaySandra Aikam Vinje MD 05/31/2017, 11:13 AM

## 2017-05-31 NOTE — Lactation Note (Signed)
This note was copied from a baby's chart. Lactation Consultation Note  Patient Name: Girl Theophilus BonesKamali Getty Today's Date: 05/31/2017 Reason for consult: Initial assessment Baby at 2 hr of life. Upon entry baby was crying in Dad's arms. Mom was agreeable to latching. Baby was sleepy at the breast. Experienced bf mom denies any trouble with bf her older child. She is worried about supply with this baby. Discussed baby behavior, feeding frequency, baby belly size, voids, wt loss, breast changes, and nipple care. Demonstrated manual expression, colostrum noted bilaterally, spoon in room. Given lactation handouts. Aware of OP services and support group. Mom will offer the breast on demand 8+/24hr, post express, and spoon feed as needed.      Maternal Data Has patient been taught Hand Expression?: Yes Does the patient have breastfeeding experience prior to this delivery?: Yes  Feeding Feeding Type: Breast Fed Length of feed: 5 min  LATCH Score/Interventions Latch: Repeated attempts needed to sustain latch, nipple held in mouth throughout feeding, stimulation needed to elicit sucking reflex. Intervention(s): Adjust position;Assist with latch  Audible Swallowing: None Intervention(s): Skin to skin;Hand expression Intervention(s): Alternate breast massage  Type of Nipple: Everted at rest and after stimulation  Comfort (Breast/Nipple): Soft / non-tender     Hold (Positioning): Full assist, staff holds infant at breast Intervention(s): Position options;Support Pillows  LATCH Score: 5  Lactation Tools Discussed/Used     Consult Status Consult Status: Follow-up Date: 06/01/17 Follow-up type: In-patient    Rulon Eisenmengerlizabeth E Sianne Tejada 05/31/2017, 9:42 PM

## 2017-05-31 NOTE — Anesthesia Preprocedure Evaluation (Signed)
Anesthesia Evaluation  Patient identified by MRN, date of birth, ID band Patient awake    Reviewed: Allergy & Precautions, H&P , NPO status , Patient's Chart, lab work & pertinent test results  Airway Mallampati: II   Neck ROM: full    Dental   Pulmonary neg pulmonary ROS,    breath sounds clear to auscultation       Cardiovascular negative cardio ROS   Rhythm:regular Rate:Normal     Neuro/Psych    GI/Hepatic GERD  ,  Endo/Other    Renal/GU      Musculoskeletal   Abdominal   Peds  Hematology   Anesthesia Other Findings   Reproductive/Obstetrics (+) Pregnancy                             Anesthesia Physical Anesthesia Plan  ASA: II  Anesthesia Plan: Epidural   Post-op Pain Management:    Induction: Intravenous  PONV Risk Score and Plan: 2 and Treatment may vary due to age or medical condition  Airway Management Planned: Natural Airway  Additional Equipment:   Intra-op Plan:   Post-operative Plan:   Informed Consent: I have reviewed the patients History and Physical, chart, labs and discussed the procedure including the risks, benefits and alternatives for the proposed anesthesia with the patient or authorized representative who has indicated his/her understanding and acceptance.       Plan Discussed with: Anesthesiologist  Anesthesia Plan Comments:         Anesthesia Quick Evaluation  

## 2017-05-31 NOTE — Progress Notes (Signed)
Subjective:  Comfortable with  Epidural Contractions every 2 minutes, lasting 60 seconds    Objective: BP (!) 96/55   Pulse 66   Temp 97.9 F (36.6 C) (Oral)   Ht 5\' 4"  (1.626 m)   Wt 66.7 kg (147 lb)   LMP 08/17/2016   SpO2 98%   BMI 25.23 kg/m  No intake/output data recorded. No intake/output data recorded.  FHT:  Category 1 SVE:   Dilation: 4.5 Effacement (%): 90 Station: 0 Exam by:: Inas Avena  Labs: Lab Results  Component Value Date   WBC 10.6 (H) 05/31/2017   HGB 13.2 05/31/2017   HCT 38.8 05/31/2017   MCV 88.0 05/31/2017   PLT 158 05/31/2017    Assessment / Plan: Induction of labor progressing well on pitocin SROM 14:30 clear fluid with bloody show Fetal Wellbeing: reassuring Anticipated MOD:  NSVD  Manu Rubey A 05/31/2017, 2:40 PM

## 2017-05-31 NOTE — Anesthesia Procedure Notes (Signed)
Epidural Patient location during procedure: OB Start time: 05/31/2017 12:04 PM End time: 05/31/2017 12:17 PM  Staffing Anesthesiologist: Chaney MallingHODIERNE, Amaal Dimartino Performed: anesthesiologist   Preanesthetic Checklist Completed: patient identified, site marked, pre-op evaluation, timeout performed, IV checked, risks and benefits discussed and monitors and equipment checked  Epidural Patient position: sitting Prep: DuraPrep Patient monitoring: heart rate, cardiac monitor, continuous pulse ox and blood pressure Approach: midline Location: L2-L3 Injection technique: LOR saline  Needle:  Needle type: Tuohy  Needle gauge: 17 G Needle length: 9 cm Needle insertion depth: 4 cm Catheter type: closed end flexible Catheter size: 19 Gauge Catheter at skin depth: 10 cm Test dose: negative and Other  Assessment Events: blood not aspirated, injection not painful, no injection resistance and negative IV test  Additional Notes Informed consent obtained prior to proceeding including risk of failure, 1% risk of PDPH, risk of minor discomfort and bruising.  Discussed rare but serious complications including epidural abscess, permanent nerve injury, epidural hematoma.  Discussed alternatives to epidural analgesia and patient desires to proceed.  Timeout performed pre-procedure verifying patient name, procedure, and platelet count.  Patient tolerated procedure well. Reason for block:procedure for pain

## 2017-06-01 LAB — CBC
HEMATOCRIT: 33.9 % — AB (ref 36.0–46.0)
HEMOGLOBIN: 11.5 g/dL — AB (ref 12.0–15.0)
MCH: 29.9 pg (ref 26.0–34.0)
MCHC: 33.9 g/dL (ref 30.0–36.0)
MCV: 88.3 fL (ref 78.0–100.0)
Platelets: 165 10*3/uL (ref 150–400)
RBC: 3.84 MIL/uL — AB (ref 3.87–5.11)
RDW: 15.3 % (ref 11.5–15.5)
WBC: 16.2 10*3/uL — ABNORMAL HIGH (ref 4.0–10.5)

## 2017-06-01 LAB — BIRTH TISSUE RECOVERY COLLECTION (PLACENTA DONATION)

## 2017-06-01 LAB — RPR: RPR Ser Ql: NONREACTIVE

## 2017-06-01 NOTE — Lactation Note (Signed)
This note was copied from a baby's chart. Lactation Consultation Note  Patient Name: Wanda Reilly ONGEX'BToday's Date: 06/01/2017 Reason for consult: Follow-up assessment Baby at 25 hr of life and mom was requesting latch help. Mom stated that baby "screams, takes for every to latch, it pinches when she sucks, and she stays on for 45 minutes". Showed mom how to get a deep latch in cross cradle and how to do breast compression. Discussed baby behavior, feeding frequency, baby belly size, voids, wt loss, breast changes, and nipple care. Did manual expression and large drops of colostrum noted. Mom is worried because her expressed milk is white/clear. Briefly explained breast milk composition. Parents are of lactation services and support group.    Maternal Data    Feeding Feeding Type: Breast Fed Length of feed: 20 min  LATCH Score/Interventions Latch: Repeated attempts needed to sustain latch, nipple held in mouth throughout feeding, stimulation needed to elicit sucking reflex. Intervention(s): Adjust position;Assist with latch  Audible Swallowing: A few with stimulation Intervention(s): Skin to skin;Hand expression Intervention(s): Alternate breast massage  Type of Nipple: Everted at rest and after stimulation  Comfort (Breast/Nipple): Soft / non-tender  Problem noted: Mild/Moderate discomfort  Hold (Positioning): Assistance needed to correctly position infant at breast and maintain latch. Intervention(s): Position options;Support Pillows  LATCH Score: 7  Lactation Tools Discussed/Used     Consult Status Consult Status: Follow-up Date: 06/02/17 Follow-up type: In-patient    Wanda Reilly 06/01/2017, 8:39 PM

## 2017-06-01 NOTE — Progress Notes (Signed)
Subjective: Postpartum Day 1: Vaginal delivery, 2nd laceration Patient up ad lib, reports no syncope or dizziness. Feeding:  Breastfeeding, difficult latch Contraceptive plan:  Undecided Plans on discharge tomorrow due to GBS positive status.  Objective: Vital signs in last 24 hours: Temp:  [97.9 F (36.6 C)-98.5 F (36.9 C)] 98.5 F (36.9 C) (07/18 0511) Pulse Rate:  [44-102] 67 (07/18 0511) Resp:  [18] 18 (07/18 0511) BP: (77-151)/(24-109) 84/56 (07/18 0511) SpO2:  [97 %-99 %] 98 % (07/17 1310)  Physical Exam:  General: alert, cooperative and no distress Lochia: appropriate Uterine Fundus: firm Perineum: healing well DVT Evaluation: No evidence of DVT seen on physical exam. Negative Homan's sign.   CBC Latest Ref Rng & Units 06/01/2017 05/31/2017 04/30/2017  WBC 4.0 - 10.5 K/uL 16.2(H) 10.6(H) 11.9(H)  Hemoglobin 12.0 - 15.0 g/dL 11.5(L) 13.2 12.9  Hematocrit 36.0 - 46.0 % 33.9(L) 38.8 37.7  Platelets 150 - 400 K/uL 165 158 188     Assessment/Plan: Status post vaginal delivery day 1. Stable Continue current care. Plan for discharge tomorrow and Lactation consult    Henderson Newcomerancy Jean ProtheroCNM 06/01/2017, 9:33 AM

## 2017-06-01 NOTE — Anesthesia Postprocedure Evaluation (Signed)
Anesthesia Post Note  Patient: Wanda Reilly  Procedure(s) Performed: * No procedures listed *     Patient location during evaluation: Mother Baby Anesthesia Type: Epidural Level of consciousness: awake and alert and oriented Pain management: satisfactory to patient Vital Signs Assessment: post-procedure vital signs reviewed and stable Respiratory status: spontaneous breathing and nonlabored ventilation Cardiovascular status: stable Postop Assessment: no headache, no backache, no signs of nausea or vomiting, adequate PO intake and patient able to bend at knees (patient up walking) Anesthetic complications: no    Last Vitals:  Vitals:   06/01/17 0511 06/01/17 1336  BP: (!) 84/56 (!) 97/49  Pulse: 67   Resp: 18 17  Temp: 36.9 C 36.7 C    Last Pain:  Vitals:   06/01/17 1336  TempSrc: Oral  PainSc: 0-No pain   Pain Goal:                 [name]

## 2017-06-02 MED ORDER — IBUPROFEN 600 MG PO TABS: 600.0000 mg | ORAL_TABLET | Freq: Four times a day (QID) | ORAL | 1 refills | Status: AC

## 2017-06-02 NOTE — Discharge Summary (Signed)
OB Discharge Summary     Patient Name: Wanda PoundsKamali Reilly DOB: 1976-03-12 MRN: [id number]  Date of admission: 05/31/2017 Delivering MD: Silverio LayIVARD, SANDRA   Date of discharge: 06/02/2017  Admitting diagnosis: 41wks induction  Intrauterine pregnancy: 8867w0d     Secondary diagnosis:  Active Problems:   Post term pregnancy, antepartum condition or complication   Second-degree perineal laceration, with delivery  Additional problems: AMA, Rh Negative, GBS Positive     Discharge diagnosis: Term Pregnancy Delivered                                                                                                Post partum procedures:None  Augmentation: Induction  Complications: None  Hospital course:  Induction of Labor With Vaginal Delivery   41 y.o. yo [id number] at 5967w0d was admitted to the hospital 05/31/2017 for induction of labor.  Indication for induction: Postdates.  Patient had an uncomplicated labor course as follows: Membrane Rupture Time/Date: 2:34 PM ,05/31/2017   Intrapartum Procedures: Episiotomy: None [1]                                         Lacerations:  2nd degree [3];Perineal [11]  Patient had delivery of a Viable infant.  Information for the patient's newborn:  Postema, Girl Wanda Reilly [[id number]][[id number]]  Delivery Method: Vaginal, Spontaneous Delivery (Filed from Delivery Summary)   05/31/2017  Details of delivery can be found in separate delivery note.  Patient had a routine postpartum course. Patient is discharged home 06/02/17.  Physical exam  Vitals:   06/01/17 0511 06/01/17 1336 06/01/17 1800 06/02/17 0500  BP: (!) 84/56 (!) 97/49 (!) 100/55 (!) 94/50  Pulse: 67  85 60  Resp: 18 17 18 16   Temp: 98.5 F (36.9 C) 98 F (36.7 C) 97.9 F (36.6 C) (!) 97.5 F (36.4 C)  TempSrc: Oral Oral Oral Oral  SpO2:      Weight:      Height:       General: alert, cooperative and no distress  Chest: HRRR, Lungs CTA Abd: Soft, NT, BS x4 Lochia: appropriate-Laceration Healing  Well Uterine Fundus: firm/U-1 Incision: N/A DVT Evaluation: No significant calf/ankle edema. Labs: Lab Results  Component Value Date   WBC 16.2 (H) 06/01/2017   HGB 11.5 (L) 06/01/2017   HCT 33.9 (L) 06/01/2017   MCV 88.3 06/01/2017   PLT 165 06/01/2017   No flowsheet data found.  Discharge instruction: per After Visit Summary and "Baby and Me Booklet". Pain Management, Peri-Care, Breastfeeding, Who and When to call for postpartum complications. Information Sheet(s) given PP Baby Blues and Depression.    After visit meds:  Allergies as of 06/02/2017      Reactions   Acyclovir And Related    Iodine Other (See Comments)   Reaction:  Unknown    Lodine [etodolac] Swelling, Other (See Comments)   Reaction:  Unspecified swelling reaction. Pt states she can tolerate ibuprofen      Medication List    STOP taking these medications  acidophilus Caps capsule   Vitamin D3 Liqd     TAKE these medications   ibuprofen 600 MG tablet Commonly known as:  ADVIL,MOTRIN Take 1 tablet (600 mg total) by mouth every 6 (six) hours.   prenatal multivitamin Tabs tablet Take 1 tablet by mouth daily at 12 noon.       Diet: routine diet  Activity: Advance as tolerated. Pelvic rest for 6 weeks.   Outpatient follow up:6 weeks Follow up Appt:No future appointments. Follow up Visit:No Follow-up on file.  Postpartum contraception: Condoms  Newborn Data: Live born female  Birth Weight: 7 lb 11.1 oz (3490 g) APGAR: 8, 9  Baby Feeding: Breast Disposition:home with mother   06/02/2017 Cherre Robins, CNM

## 2017-06-02 NOTE — Lactation Note (Signed)
This note was copied from a baby's chart. Lactation Consultation Note  Baby 9139 hours old and has been off and on breast. Mother's nipples are sore.  Suggest applying ebm.  Crack at base of R nipple. Noted short posterior lingual and labial frenulum.  Baby has disorganized suck and at time is biting. Provided family with resource sheets. RN recently supplemented w/ formula using syringe. LC set up DEBP and mother pumped 16 ml of breastmilk. Mother needed help with hand expression.  Reviewed w/ mother and mother was able to express good flow of breastmilk. Suggest pumping 4-6 times per day for 10-20 min.  Give volume back to baby at next feeding. Encouraged mother to do hands on pumping and hand express often to build milk supply. Attempted latching with a #20 & #24.  Prefilled with breastmilk but after a few minutes baby fell asleep. Provided sheet w/ volume guidelines. Reviewed engorgement care and monitoring voids/stools.    Patient Name: Girl Theophilus BonesKamali Coote WUJWJ'XToday's Date: 06/02/2017 Reason for consult: Follow-up assessment   Maternal Data    Feeding Feeding Type: Breast Fed Nipple Type: Slow - flow Length of feed: 5 min  LATCH Score/Interventions Latch: Repeated attempts needed to sustain latch, nipple held in mouth throughout feeding, stimulation needed to elicit sucking reflex. Intervention(s): Adjust position;Assist with latch;Breast massage  Audible Swallowing: A few with stimulation Intervention(s): Skin to skin;Hand expression Intervention(s): Skin to skin;Hand expression;Alternate breast massage  Type of Nipple: Everted at rest and after stimulation  Comfort (Breast/Nipple): Soft / non-tender     Hold (Positioning): Assistance needed to correctly position infant at breast and maintain latch.  LATCH Score: 7  Lactation Tools Discussed/Used Pump Review: Milk Storage;Setup, frequency, and cleaning Initiated by:: Dahlia Byesuth Malachai Schalk RN IBCLC Date initiated::  06/02/17   Consult Status Consult Status: Complete    Dahlia ByesBerkelhammer, Julyan Gales Boschen 06/02/2017, 10:12 AM

## 2017-06-02 NOTE — Discharge Instructions (Signed)
Iron-Rich Diet Iron is a mineral that helps your body to produce hemoglobin. Hemoglobin is a protein in your red blood cells that carries oxygen to your body's tissues. Eating too little iron may cause you to feel weak and tired, and it can increase your risk for infection. Eating enough iron is necessary for your body's metabolism, muscle function, and nervous system. Iron is naturally found in many foods. It can also be added to foods or fortified in foods. There are two types of dietary iron:  Heme iron. Heme iron is absorbed by the body more easily than nonheme iron. Heme iron is found in meat, poultry, and fish.  Nonheme iron. Nonheme iron is found in dietary supplements, iron-fortified grains, beans, and vegetables.  You may need to follow an iron-rich diet if:  You have been diagnosed with iron deficiency or iron-deficiency anemia.  You have a condition that prevents you from absorbing dietary iron, such as: ? Infection in your intestines. ? Celiac disease. This involves long-lasting (chronic) inflammation of your intestines.  You do not eat enough iron.  You eat a diet that is high in foods that impair iron absorption.  You have lost a lot of blood.  You have heavy bleeding during your menstrual cycle.  You are pregnant.  What is my plan? Your health care provider may help you to determine how much iron you need per day based on your condition. Generally, when a person consumes sufficient amounts of iron in the diet, the following iron needs are met:  Men. ? 66-61 years old: 11 mg per day. ? 16-18 years old: 8 mg per day.  Women. ? 50-5 years old: 15 mg per day. ? 47-2 years old: 18 mg per day. ? Over 43 years old: 8 mg per day. ? Pregnant women: 27 mg per day. ? Breastfeeding women: 9 mg per day.  What do I need to know about an iron-rich diet?  Eat fresh fruits and vegetables that are high in vitamin C along with foods that are high in iron. This will help  increase the amount of iron that your body absorbs from food, especially with foods containing nonheme iron. Foods that are high in vitamin C include oranges, peppers, tomatoes, and mango.  Take iron supplements only as directed by your health care provider. Overdose of iron can be life-threatening. If you were prescribed iron supplements, take them with orange juice or a vitamin C supplement.  Cook foods in pots and pans that are made from iron.  Eat nonheme iron-containing foods alongside foods that are high in heme iron. This helps to improve your iron absorption.  Certain foods and drinks contain compounds that impair iron absorption. Avoid eating these foods in the same meal as iron-rich foods or with iron supplements. These include: ? Coffee, black tea, and red wine. ? Milk, dairy products, and foods that are high in calcium. ? Beans, soybeans, and peas. ? Whole grains.  When eating foods that contain both nonheme iron and compounds that impair iron absorption, follow these tips to absorb iron better. ? Soak beans overnight before cooking. ? Soak whole grains overnight and drain them before using. ? Ferment flours before baking, such as using yeast in bread dough. What foods can I eat? Grains Iron-fortified breakfast cereal. Iron-fortified whole-wheat bread. Enriched rice. Sprouted grains. Vegetables Spinach. Potatoes with skin. Green peas. Broccoli. Red and green bell peppers. Fermented vegetables. Fruits Prunes. Raisins. Oranges. Strawberries. Mango. Grapefruit. Meats and Other Protein Sources  Beef liver. Oysters. Beef. Shrimp. Turkey. Chicken. Tuna. Sardines. Chickpeas. Nuts. Tofu. °Beverages °Tomato juice. Fresh orange juice. Prune juice. Hibiscus tea. Fortified instant breakfast shakes. °Condiments °Tahini. Fermented soy sauce. °Sweets and Desserts °Black-strap molasses. °Other °Wheat germ. °The items listed above may not be a complete list of recommended foods or beverages.  Contact your dietitian for more options. °What foods are not recommended? °Grains °Whole grains. Bran cereal. Bran flour. Oats. °Vegetables °Artichokes. Brussels sprouts. Kale. °Fruits °Blueberries. Raspberries. Strawberries. Figs. °Meats and Other Protein Sources °Soybeans. Products made from soy protein. °Dairy °Milk. Cream. Cheese. Yogurt. Cottage cheese. °Beverages °Coffee. Black tea. Red wine. °Sweets and Desserts °Cocoa. Chocolate. Ice cream. °Other °Basil. Oregano. Parsley. °The items listed above may not be a complete list of foods and beverages to avoid. Contact your dietitian for more information. °This information is not intended to replace advice given to you by your health care provider. Make sure you discuss any questions you have with your health care provider. °Document Released: 06/15/2005 Document Revised: 05/21/2016 Document Reviewed: 05/29/2014 °[employer] Interactive Patient Education © 2018 [employer] °Postpartum Depression and Baby Blues °The postpartum period begins right after the birth of a baby. During this time, there is often a great amount of joy and excitement. It is also a time of many changes in the life of the parents. Regardless of how many times a mother gives birth, each child brings new challenges and dynamics to the family. It is not unusual to have feelings of excitement along with confusing shifts in moods, emotions, and thoughts. All mothers are at risk of developing postpartum depression or the "baby blues." These mood changes can occur right after giving birth, or they may occur many months after giving birth. The baby blues or postpartum depression can be mild or severe. Additionally, postpartum depression can go away rather quickly, or it can be a long-term condition. °What are the causes? °Raised hormone levels and the rapid drop in those levels are thought to be a main cause of postpartum depression and the baby blues. A number of hormones change during and after  pregnancy. Estrogen and progesterone usually decrease right after the delivery of your baby. The levels of thyroid hormone and various cortisol steroids also rapidly drop. Other factors that play a role in these mood changes include major life events and genetics. °What increases the risk? °If you have any of the following risks for the baby blues or postpartum depression, know what symptoms to watch out for during the postpartum period. Risk factors that may increase the likelihood of getting the baby blues or postpartum depression include: °· Having a personal or family history of depression. °· Having depression while being pregnant. °· Having premenstrual mood issues or mood issues related to oral contraceptives. °· Having a lot of life stress. °· Having marital conflict. °· Lacking a social support network. °· Having a baby with special needs. °· Having health problems, such as diabetes. ° °What are the signs or symptoms? °Symptoms of baby blues include: °· Brief changes in mood, such as going from extreme happiness to sadness. °· Decreased concentration. °· Difficulty sleeping. °· Crying spells, tearfulness. °· Irritability. °· Anxiety. ° °Symptoms of postpartum depression typically begin within the first month after giving birth. These symptoms include: °· Difficulty sleeping or excessive sleepiness. °· Marked weight loss. °· Agitation. °· Feelings of worthlessness. °· Lack of interest in activity or food. ° °Postpartum psychosis is a very serious condition and can be dangerous. Fortunately, it   is rare. Displaying any of the following symptoms is cause for immediate medical attention. Symptoms of postpartum psychosis include: °· Hallucinations and delusions. °· Bizarre or disorganized behavior. °· Confusion or disorientation. ° °How is this diagnosed? °A diagnosis is made by an evaluation of your symptoms. There are no medical or lab tests that lead to a diagnosis, but there are various questionnaires that a  health care provider may use to identify those with the baby blues, postpartum depression, or psychosis. Often, a screening tool called the Edinburgh Postnatal Depression Scale is used to diagnose depression in the postpartum period. °How is this treated? °The baby blues usually goes away on its own in 1-2 weeks. Social support is often all that is needed. You will be encouraged to get adequate sleep and rest. Occasionally, you may be given medicines to help you sleep. °Postpartum depression requires treatment because it can last several months or longer if it is not treated. Treatment may include individual or group therapy, medicine, or both to address any social, physiological, and psychological factors that may play a role in the depression. Regular exercise, a healthy diet, rest, and social support may also be strongly recommended. °Postpartum psychosis is more serious and needs treatment right away. Hospitalization is often needed. °Follow these instructions at home: °· Get as much rest as you can. Nap when the baby sleeps. °· Exercise regularly. Some women find yoga and walking to be beneficial. °· Eat a balanced and nourishing diet. °· Do little things that you enjoy. Have a cup of tea, take a bubble bath, read your favorite magazine, or listen to your favorite music. °· Avoid alcohol. °· Ask for help with household chores, cooking, grocery shopping, or running errands as needed. Do not try to do everything. °· Talk to people close to you about how you are feeling. Get support from your partner, family members, friends, or other new moms. °· Try to stay positive in how you think. Think about the things you are grateful for. °· Do not spend a lot of time alone. °· Only take over-the-counter or prescription medicine as directed by your health care provider. °· Keep all your postpartum appointments. °· Let your health care provider know if you have any concerns. °Contact a health care provider if: °You are  having a reaction to or problems with your medicine. °Get help right away if: °· You have suicidal feelings. °· You think you may harm the baby or someone else. °This information is not intended to replace advice given to you by your health care provider. Make sure you discuss any questions you have with your health care provider. °Document Released: 08/05/2004 Document Revised: 04/08/2016 Document Reviewed: 08/13/2013 °[employer] Interactive Patient Education © 2017 [employer] ° °

## 2017-06-04 LAB — TYPE AND SCREEN
ABO/RH(D): O NEG
ANTIBODY SCREEN: POSITIVE
DAT, IgG: NEGATIVE
UNIT DIVISION: 0
UNIT DIVISION: 0

## 2017-06-04 LAB — BPAM RBC
BLOOD PRODUCT EXPIRATION DATE: 201808052359
Blood Product Expiration Date: 201808162359
UNIT TYPE AND RH: 9500
Unit Type and Rh: 9500

## 2018-05-25 ENCOUNTER — Ambulatory Visit: Payer: BLUE CROSS/BLUE SHIELD | Admitting: Physician Assistant

## 2024-08-21 ENCOUNTER — Other Ambulatory Visit: Payer: Self-pay | Admitting: Obstetrics and Gynecology

## 2024-08-21 DIAGNOSIS — Z1231 Encounter for screening mammogram for malignant neoplasm of breast: Secondary | ICD-10-CM
